# Patient Record
Sex: Female | Born: 1935 | Race: Black or African American | Hispanic: No | Smoking: Never smoker
Health system: Southern US, Community
[De-identification: ages and names within clinical notes are randomized; demographics above are authoritative.]

## PROBLEM LIST (undated history)

## (undated) DIAGNOSIS — Q2730 Arteriovenous malformation, site unspecified: Secondary | ICD-10-CM

## (undated) DIAGNOSIS — D509 Iron deficiency anemia, unspecified: Secondary | ICD-10-CM

## (undated) DIAGNOSIS — A048 Other specified bacterial intestinal infections: Secondary | ICD-10-CM

## (undated) DIAGNOSIS — E669 Obesity, unspecified: Secondary | ICD-10-CM

## (undated) HISTORY — DX: Arteriovenous malformation, site unspecified: Q27.30

## (undated) HISTORY — DX: Iron deficiency anemia, unspecified: D50.9

## (undated) HISTORY — DX: Other specified bacterial intestinal infections: A04.8

## (undated) HISTORY — DX: Obesity, unspecified: E66.9

## (undated) HISTORY — PX: TUBAL LIGATION: SHX77

---

## 2012-08-22 ENCOUNTER — Encounter (HOSPITAL_COMMUNITY): Payer: Self-pay | Admitting: Emergency Medicine

## 2012-08-22 ENCOUNTER — Emergency Department (HOSPITAL_COMMUNITY): Payer: Medicare Other

## 2012-08-22 ENCOUNTER — Emergency Department (HOSPITAL_COMMUNITY)
Admission: EM | Admit: 2012-08-22 | Discharge: 2012-08-22 | Disposition: A | Discharge status: Home / Self Care | Payer: Medicare Other | Attending: Emergency Medicine | Admitting: Emergency Medicine

## 2012-08-22 ENCOUNTER — Encounter (HOSPITAL_COMMUNITY): Payer: Self-pay | Admitting: *Deleted

## 2012-08-22 ENCOUNTER — Emergency Department (INDEPENDENT_AMBULATORY_CARE_PROVIDER_SITE_OTHER)
Admission: EM | Admit: 2012-08-22 | Discharge: 2012-08-22 | Disposition: A | Discharge status: Nursing Home (Includes ICF) | Payer: Medicare Other | Source: Home / Self Care | Attending: Emergency Medicine | Admitting: Emergency Medicine

## 2012-08-22 DIAGNOSIS — R110 Nausea: Secondary | ICD-10-CM | POA: Insufficient documentation

## 2012-08-22 DIAGNOSIS — I209 Angina pectoris, unspecified: Secondary | ICD-10-CM

## 2012-08-22 DIAGNOSIS — N201 Calculus of ureter: Secondary | ICD-10-CM | POA: Insufficient documentation

## 2012-08-22 DIAGNOSIS — R319 Hematuria, unspecified: Secondary | ICD-10-CM | POA: Insufficient documentation

## 2012-08-22 LAB — COMPREHENSIVE METABOLIC PANEL
AST: 19 U/L (ref 0–37)
Albumin: 3.7 g/dL (ref 3.5–5.2)
BUN: 16 mg/dL (ref 6–23)
Calcium: 9.9 mg/dL (ref 8.4–10.5)
Chloride: 100 mEq/L (ref 96–112)
Creatinine, Ser: 0.76 mg/dL (ref 0.50–1.10)
Total Protein: 8.7 g/dL — ABNORMAL HIGH (ref 6.0–8.3)

## 2012-08-22 LAB — URINALYSIS, ROUTINE W REFLEX MICROSCOPIC
Nitrite: NEGATIVE
Specific Gravity, Urine: 1.014 (ref 1.005–1.030)
Urobilinogen, UA: 1 mg/dL (ref 0.0–1.0)
pH: 6.5 (ref 5.0–8.0)

## 2012-08-22 LAB — TROPONIN I: Troponin I: <0.3 ng/mL (ref <0.30)

## 2012-08-22 LAB — CBC WITH DIFFERENTIAL/PLATELET
Basophils Absolute: 0 10*3/uL (ref 0.0–0.1)
Basophils Relative: 0 % (ref 0–1)
Eosinophils Absolute: 0 10*3/uL (ref 0.0–0.7)
Eosinophils Relative: 0 % (ref 0–5)
HCT: 38.3 % (ref 36.0–46.0)
MCH: 28.2 pg (ref 26.0–34.0)
MCHC: 32.1 g/dL (ref 30.0–36.0)
Monocytes Absolute: 0.3 10*3/uL (ref 0.1–1.0)
Neutro Abs: 11.2 10*3/uL — ABNORMAL HIGH (ref 1.7–7.7)
RDW: 12.8 % (ref 11.5–15.5)

## 2012-08-22 LAB — URINE MICROSCOPIC-ADD ON

## 2012-08-22 LAB — LIPASE, BLOOD: Lipase: 37 U/L (ref 11–59)

## 2012-08-22 MED ORDER — NITROGLYCERIN 0.4 MG SL SUBL: 0.4000 mg | SUBLINGUAL_TABLET | SUBLINGUAL | Status: DC | PRN

## 2012-08-22 MED ORDER — ASPIRIN 81 MG PO CHEW
CHEWABLE_TABLET | ORAL | Status: AC
  Filled 2012-08-22: qty 4

## 2012-08-22 MED ORDER — SODIUM CHLORIDE 0.9 % IV SOLN: INTRAVENOUS | Status: DC

## 2012-08-22 MED ORDER — SODIUM CHLORIDE 0.9 % IJ SOLN
INTRAMUSCULAR | Status: AC
  Filled 2012-08-22: qty 3

## 2012-08-22 MED ORDER — ASPIRIN 81 MG PO CHEW
324.0000 mg | CHEWABLE_TABLET | Freq: Once | ORAL | Status: AC
  Administered 2012-08-22: 324 mg via ORAL

## 2012-08-22 MED ORDER — NITROGLYCERIN 0.4 MG SL SUBL
SUBLINGUAL_TABLET | SUBLINGUAL | Status: AC
  Filled 2012-08-22: qty 25

## 2012-08-22 MED ORDER — OXYCODONE-ACETAMINOPHEN 5-325 MG PO TABS: 1.0000 | ORAL_TABLET | Freq: Four times a day (QID) | ORAL | Status: DC | PRN

## 2012-08-22 NOTE — ED Provider Notes (Signed)
History     CSN: [id number]  Arrival date & time 08/22/12  2006   First MD Initiated Contact with Patient 08/22/12 2009      Chief Complaint  Patient presents with  . Abdominal Pain  . Nausea    (Consider location/radiation/quality/duration/timing/severity/associated sxs/prior treatment) Patient is a 79 y.o. female presenting with abdominal pain.  Abdominal Pain Associated symptoms include abdominal pain.   Pt reports she has had intermittent episodes of sharp, moderate epigastric pain off and on all day, radiating into her back. No SOB, CP, or diaphoresis. She has had some nausea. No particular provoking or relieving factors but has not had any appetite for solids today. She drank some gingerale earlier without difficulty. Went to [facility] and sent to the ED for further eval. She is pain free now. Denies any vomiting, melena, diarrhea, constipation, dysuria or flank pain.    History reviewed. No pertinent past medical history.  History reviewed. No pertinent past surgical history.  No family history on file.  History  Substance Use Topics  . Smoking status: Never Smoker   . Smokeless tobacco: Not on file  . Alcohol Use: No    OB History   Grav Para Term Preterm Abortions TAB SAB Ect Mult Living                  Review of Systems  Gastrointestinal: Positive for abdominal pain.   All other systems reviewed and are negative except as noted in HPI.   Allergies  Review of patient's allergies indicates no known allergies.  Home Medications  No current outpatient prescriptions on file.  BP 155/60  Pulse 97  Temp(Src) 98 F (36.7 C) (Oral)  Resp 17  SpO2 98%  Physical Exam  Nursing note and vitals reviewed. Constitutional: She is oriented to person, place, and time. She appears well-developed and well-nourished.  HENT:  Head: Normocephalic and atraumatic.  Eyes: EOM are normal. Pupils are equal, round, and reactive to light.  Neck: Normal range of motion. Neck  supple.  Cardiovascular: Normal rate, normal heart sounds and intact distal pulses.   Pulmonary/Chest: Effort normal and breath sounds normal.  Abdominal: Bowel sounds are normal. She exhibits no distension. There is no tenderness.  Musculoskeletal: Normal range of motion. She exhibits no edema and no tenderness.  Neurological: She is alert and oriented to person, place, and time. She has normal strength. No cranial nerve deficit or sensory deficit.  Skin: Skin is warm and dry. No rash noted.  Psychiatric: She has a normal mood and affect.    ED Course  Procedures (including critical care time)  Labs Reviewed  CBC WITH DIFFERENTIAL - Abnormal; Notable for the following:    WBC 12.5 (*)    Neutrophils Relative % 90 (*)    Neutro Abs 11.2 (*)    Lymphocytes Relative 7 (*)    All other components within normal limits  COMPREHENSIVE METABOLIC PANEL - Abnormal; Notable for the following:    Glucose, Bld 259 (*)    Total Protein 8.7 (*)    GFR calc non Af Amer 75 (*)    GFR calc Af Amer 87 (*)    All other components within normal limits  URINALYSIS, ROUTINE W REFLEX MICROSCOPIC - Abnormal; Notable for the following:    APPearance CLOUDY (*)    Glucose, UA 500 (*)    Hgb urine dipstick LARGE (*)    Protein, ur 30 (*)    Leukocytes, UA TRACE (*)  All other components within normal limits  URINE MICROSCOPIC-ADD ON - Abnormal; Notable for the following:    Squamous Epithelial / LPF FEW (*)    All other components within normal limits  LIPASE, BLOOD  TROPONIN I   Ct Abdomen Pelvis Wo Contrast  08/22/2012   *RADIOLOGY REPORT*  Clinical Data: Some xyphoid and back pain  CT ABDOMEN AND PELVIS WITHOUT CONTRAST  Technique:  Multidetector CT imaging of the abdomen and pelvis was performed following the standard protocol without intravenous contrast.  Comparison: Ultrasound from the same day  Findings: Coarse linear scarring or subsegmental atelectasis in the visualized lung bases.  Coronary  calcifications.  Unremarkable uninfused evaluation of the liver, nondilated gallbladder, spleen, pancreas.  There is a 2.7 cm left adrenal mass of low attenuation suggesting adenoma, containing some coarse calcifications at its margins.  Right nephrolithiasis, largest stone or cluster in the upper pole measuring 12 mm.  There is a right ureterectasis down to the level of a 8 mm calculus at the ureteral orifice.  Urinary bladder is incompletely distended.  Left kidney unremarkable.  Patchy aortoiliac arterial calcifications.  Stomach, small bowel, and colon are nondilated.  Normal appendix.  Innumerable descending and sigmoid diverticula without adjacent inflammatory/edematous change.  Uterus and adnexal regions unremarkable.  Bilateral pelvic phleboliths.  No ascites.  No free air.  No adenopathy localized. Spondylitic changes throughout the lumbar spine.  IMPRESSION:  1.  Right nephrolithiasis and hydronephrosis with obstructing 8 mm calculus near the right ureteral orifice. 2.  2.7 cm left adrenal lesion probably benign adenoma in the absence of a history of primary carcinoma. 3.  Diverticulosis. 4. Atherosclerosis, including aortoiliac and coronary artery disease. Please note that although the presence of coronary artery calcium documents the presence of coronary artery disease, the severity of this disease and any potential stenosis cannot be assessed on this non-gated CT examination.  Assessment for potential risk factor modification, dietary therapy or pharmacologic therapy may be warranted, if clinically indicated.   Original Report Authenticated By: D. Andria Rhein, MD   US Abdomen Limited  08/22/2012   *RADIOLOGY REPORT*  Clinical Data:  Epigastric pain, nausea.  LIMITED ABDOMINAL ULTRASOUND - RIGHT UPPER QUADRANT  Comparison:  None.  Findings:  Gallbladder:  No shadowing gallstones or echogenic sludge.  No gallbladder wall thickening or pericholecystic fluid.  Negative sonographic Murphy's sign according  to the ultrasound technologist.  Common bile duct:  4.1 mm diameter, unremarkable.  Liver:  No focal mass lesion seen.  Within normal limits in parenchymal echogenicity.  Incidental note made of mild right hydronephrosis.  There is a 2.5 cm shadowing calculus in the lower pole right renal collecting system.  IMPRESSION:  1.  Normal gallbladder. 2.  Right nephrolithiasis with hydronephrosis.                    Original Report Authenticated By: D. Andria Rhein, MD     1. Right ureteral stone       MDM   Date: 08/22/2012  Rate: 103  Rhythm: sinus tachycardia and premature ventricular contractions (PVC)  QRS Axis: left  Intervals: normal  ST/T Wave abnormalities: nonspecific T wave changes  Conduction Disutrbances:right bundle branch block  Narrative Interpretation:   Old EKG Reviewed: none available  Symptoms are resolved at present. Will check abd labs, [place]. Troponin added as well for occult cardiac etiology. No chest pain or SOB suggestive of ACS.   11:04 PM Labs and imaging reviewed. Hematuria without signs of  infections. She has no gall stones, but kidney stones seen on [place] and confirmed in distal ureter on CT. Pain is well controlled now. Advised pain medications as needed and return for intractable pain, vomiting, or fever.          Charles B. Bernette Mayers, MD 08/22/12 [phone]

## 2012-08-22 NOTE — ED Notes (Signed)
Report given to [facility]. 

## 2012-08-22 NOTE — ED Notes (Signed)
Nitrostat not given... Unable to start IV.. Dr. Lorenz Coaster has been notified.

## 2012-08-22 NOTE — ED Notes (Signed)
Pt c/o epigastric pain and nauseas onset 1200/1300... Also reports that she has not had anything to eat today BP is 189/101... Denies: CP, SOB, HA, f/v/d, constipation She is alert and oriented w/no signs of acute distress.

## 2012-08-22 NOTE — ED Notes (Signed)
Pt comes from [facility]gent Care via [facility].  Pt report left lower chest/upper abd pain pain with some nausea since this morning.  325 ASA given by uregent care and IV attempted by UC staff and [facility] without success.

## 2012-08-22 NOTE — ED Provider Notes (Signed)
Chief Complaint:   Chief Complaint  Patient presents with  . Abdominal Pain    History of Present Illness:   Allison Choi is an 79 year old previously healthy female who has had recurring subxiphoid pain which radiates through to the back rated 8/10 in intensity since 12:30 today. The pain comes and goes and lasts 4-5 minutes at a time. There is nothing that makes it better or worse. It's been associated with nausea but no shortness of breath or diaphoresis. She denies any fever, chills, coughing, wheezing, or shortness of breath. She has not had palpitations, dizziness, or syncope. She denies any vomiting, constipation, diarrhea, blood in the stool, or melena. She's had no urinary symptoms or GYN complaints. She has had no prior history of heart problems, diabetes, high blood pressure, elevated cholesterol, or cigarette smoking.  Review of Systems:  Other than noted above, the patient denies any of the following symptoms. Systemic:  No fever, chills, sweats, or fatigue. ENT:  No nasal congestion, rhinorrhea, or sore throat. Pulmonary:  No cough, wheezing, shortness of breath, sputum production, hemoptysis. Cardiac:  No palpitations, rapid heartbeat, dizziness, presyncope or syncope. GI:  No abdominal pain, heartburn, nausea, or vomiting. Ext:  No leg pain or swelling.  PMFSH:  Past medical history, family history, social history, meds, and allergies were reviewed and updated as needed.   Physical Exam:   Vital signs:  BP 189/101  Pulse 109  Temp(Src) 98.1 F (36.7 C) (Oral)  Resp 16  SpO2 95% Gen:  Alert, oriented, in no distress, skin warm and dry. Eye:  PERRL, lids and conjunctivas normal.  Sclera non-icteric. ENT:  Mucous membranes moist, pharynx clear. Neck:  Supple, no adenopathy or tenderness.  No JVD. Lungs:  Clear to auscultation, no wheezes, rales or rhonchi.  No respiratory distress. Heart:  Regular rhythm.  No gallops, clicks or rubs. She has a grade 3/6 systolic ejection  murmur heard over the entire precordium. Chest:  No chest wall tenderness. Abdomen:  Soft, with mild tenderness to palpation over umbilicus without guarding or rebound, no organomegaly or mass.  Bowel sounds normal.  No pulsatile abdominal mass or bruit. Ext:  No edema.  No calf tenderness and Homann's sign negative.  Pulses full and equal. Skin:  Warm and dry.  No rash.  EKG:   Date: 08/22/2012  Rate: 103  Rhythm: sinus tachycardia and premature ventricular contractions (PVC)  QRS Axis: left  Intervals: normal  ST/T Wave abnormalities: normal  Conduction Disutrbances:right bundle branch block  Narrative Interpretation: Sinus tachycardia with occasional premature ventricular complexes and fusion complexes, left axis deviation, right bundle branch block.  Old EKG Reviewed: none available  Course in Urgent Care Center:   She was begun on IV normal saline at 50 mL per hour, monitored, given an option at 2 L per minute by nasal cannula, given nitroglycerin 0.4 mg sublingually, aspirin 325 mg by mouth.  Assessment:  The encounter diagnosis was Angina pectoris.  Differential diagnosis includes angina pectoris, esophagitis, gastritis, ulcer disease, or gallbladder disease.   Plan:   1.  The following meds were prescribed:   New Prescriptions   No medications on file   2.  The patient was transported to the emergency department via [facility] in stable condition.  Medical Decision Making:  79 year old female has a history of recurrent subxiphoid pain with radiation to back since noon today.  Associated with nausea, but no diaphoresis or shortness of breath.  No cardiac history.  EKG shows RBBB,  LAD, freq PVCs and otherwise normal.  No acute ischemic changes.  I am still concerned about angina and will transfer by [facility].  Will give TNG and ASA.     Reuben Likes, MD 08/22/12 [phone]

## 2015-05-03 ENCOUNTER — Inpatient Hospital Stay (HOSPITAL_COMMUNITY): Payer: Medicare Other

## 2015-05-03 ENCOUNTER — Encounter (HOSPITAL_COMMUNITY): Payer: Self-pay | Admitting: Emergency Medicine

## 2015-05-03 ENCOUNTER — Inpatient Hospital Stay (HOSPITAL_COMMUNITY)
Admission: EM | Admit: 2015-05-03 | Discharge: 2015-05-06 | DRG: 812 | Disposition: A | Discharge status: Home / Self Care | Payer: Medicare Other | Attending: Internal Medicine | Admitting: Internal Medicine

## 2015-05-03 DIAGNOSIS — D649 Anemia, unspecified: Secondary | ICD-10-CM | POA: Diagnosis not present

## 2015-05-03 DIAGNOSIS — R0602 Shortness of breath: Secondary | ICD-10-CM | POA: Diagnosis present

## 2015-05-03 DIAGNOSIS — Z6839 Body mass index (BMI) 39.0-39.9, adult: Secondary | ICD-10-CM | POA: Diagnosis not present

## 2015-05-03 DIAGNOSIS — E669 Obesity, unspecified: Secondary | ICD-10-CM | POA: Diagnosis present

## 2015-05-03 DIAGNOSIS — K922 Gastrointestinal hemorrhage, unspecified: Secondary | ICD-10-CM | POA: Diagnosis present

## 2015-05-03 DIAGNOSIS — K648 Other hemorrhoids: Secondary | ICD-10-CM | POA: Diagnosis present

## 2015-05-03 DIAGNOSIS — D509 Iron deficiency anemia, unspecified: Secondary | ICD-10-CM | POA: Diagnosis present

## 2015-05-03 DIAGNOSIS — I517 Cardiomegaly: Secondary | ICD-10-CM | POA: Diagnosis present

## 2015-05-03 DIAGNOSIS — K635 Polyp of colon: Secondary | ICD-10-CM | POA: Diagnosis present

## 2015-05-03 DIAGNOSIS — Q2733 Arteriovenous malformation of digestive system vessel: Secondary | ICD-10-CM

## 2015-05-03 DIAGNOSIS — D120 Benign neoplasm of cecum: Secondary | ICD-10-CM | POA: Diagnosis not present

## 2015-05-03 DIAGNOSIS — D125 Benign neoplasm of sigmoid colon: Secondary | ICD-10-CM | POA: Diagnosis not present

## 2015-05-03 DIAGNOSIS — E1165 Type 2 diabetes mellitus with hyperglycemia: Secondary | ICD-10-CM | POA: Diagnosis present

## 2015-05-03 DIAGNOSIS — R739 Hyperglycemia, unspecified: Secondary | ICD-10-CM | POA: Diagnosis present

## 2015-05-03 LAB — COMPREHENSIVE METABOLIC PANEL WITH GFR
ALT: 9 U/L — ABNORMAL LOW (ref 14–54)
AST: 15 U/L (ref 15–41)
Albumin: 2.9 g/dL — ABNORMAL LOW (ref 3.5–5.0)
Alkaline Phosphatase: 78 U/L (ref 38–126)
Anion gap: 12 (ref 5–15)
BUN: 10 mg/dL (ref 6–20)
CO2: 24 mmol/L (ref 22–32)
Calcium: 9.3 mg/dL (ref 8.9–10.3)
Chloride: 102 mmol/L (ref 101–111)
Creatinine, Ser: 0.8 mg/dL (ref 0.44–1.00)
GFR calc Af Amer: >60 mL/min (ref >60)
GFR calc non Af Amer: >60 mL/min (ref >60)
Glucose, Bld: 200 mg/dL — ABNORMAL HIGH (ref 65–99)
Potassium: 4.9 mmol/L (ref 3.5–5.1)
Sodium: 138 mmol/L (ref 135–145)
Total Bilirubin: <0.1 mg/dL — ABNORMAL LOW (ref 0.3–1.2)
Total Protein: 7.9 g/dL (ref 6.5–8.1)

## 2015-05-03 LAB — CBC
HCT: 21 % — ABNORMAL LOW (ref 36.0–46.0)
Hemoglobin: 5.2 g/dL — CL (ref 12.0–15.0)
MCH: 15.9 pg — ABNORMAL LOW (ref 26.0–34.0)
MCHC: 24.8 g/dL — ABNORMAL LOW (ref 30.0–36.0)
MCV: 64 fL — ABNORMAL LOW (ref 78.0–100.0)
Platelets: 412 K/uL — ABNORMAL HIGH (ref 150–400)
RBC: 3.28 MIL/uL — ABNORMAL LOW (ref 3.87–5.11)
RDW: 21.4 % — ABNORMAL HIGH (ref 11.5–15.5)
WBC: 9.5 K/uL (ref 4.0–10.5)

## 2015-05-03 LAB — CBC WITH DIFFERENTIAL/PLATELET
BASOS ABS: 0 10*3/uL (ref 0.0–0.1)
BASOS PCT: 0 %
Eosinophils Absolute: 0.1 10*3/uL (ref 0.0–0.7)
Eosinophils Relative: 1 %
HEMATOCRIT: 21 % — AB (ref 36.0–46.0)
Hemoglobin: 5.3 g/dL — CL (ref 12.0–15.0)
LYMPHS ABS: 1.6 10*3/uL (ref 0.7–4.0)
Lymphocytes Relative: 17 %
MCH: 16.1 pg — AB (ref 26.0–34.0)
MCHC: 25.2 g/dL — AB (ref 30.0–36.0)
MCV: 63.8 fL — ABNORMAL LOW (ref 78.0–100.0)
MONOS PCT: 7 %
Monocytes Absolute: 0.7 10*3/uL (ref 0.1–1.0)
NEUTROS ABS: 7.1 10*3/uL (ref 1.7–7.7)
Neutrophils Relative %: 75 %
Platelets: 422 10*3/uL — ABNORMAL HIGH (ref 150–400)
RBC: 3.29 MIL/uL — ABNORMAL LOW (ref 3.87–5.11)
RDW: 21.4 % — AB (ref 11.5–15.5)
WBC: 9.5 10*3/uL (ref 4.0–10.5)

## 2015-05-03 LAB — LACTATE DEHYDROGENASE: LDH: 167 U/L (ref 98–192)

## 2015-05-03 LAB — DIFFERENTIAL
Basophils Absolute: 0.1 10*3/uL (ref 0.0–0.1)
Basophils Relative: 1 %
Eosinophils Absolute: 0.1 10*3/uL (ref 0.0–0.7)
Eosinophils Relative: 1 %
LYMPHS ABS: 1.7 10*3/uL (ref 0.7–4.0)
LYMPHS PCT: 16 %
MONOS PCT: 7 %
Monocytes Absolute: 0.8 10*3/uL (ref 0.1–1.0)
NEUTROS ABS: 7.8 10*3/uL — AB (ref 1.7–7.7)
Neutrophils Relative %: 75 %
SMEAR REVIEW: INCREASED

## 2015-05-03 LAB — RETICULOCYTES
RBC.: 3.26 MIL/uL — AB (ref 3.87–5.11)
RETIC COUNT ABSOLUTE: 68.5 10*3/uL (ref 19.0–186.0)
RETIC CT PCT: 2.1 % (ref 0.4–3.1)

## 2015-05-03 LAB — PREPARE RBC (CROSSMATCH)

## 2015-05-03 LAB — POC OCCULT BLOOD, ED: Fecal Occult Bld: POSITIVE — AB

## 2015-05-03 LAB — ABO/RH: ABO/RH(D): B POS

## 2015-05-03 LAB — SAVE SMEAR

## 2015-05-03 MED ORDER — PANTOPRAZOLE SODIUM 40 MG IV SOLR
40.0000 mg | Freq: Two times a day (BID) | INTRAVENOUS | Status: DC
  Administered 2015-05-04: 40 mg via INTRAVENOUS
  Filled 2015-05-03: qty 40

## 2015-05-03 MED ORDER — ONDANSETRON HCL 4 MG/2ML IJ SOLN: 4.0000 mg | Freq: Four times a day (QID) | INTRAMUSCULAR | Status: DC | PRN

## 2015-05-03 MED ORDER — ONDANSETRON HCL 4 MG PO TABS: 4.0000 mg | ORAL_TABLET | Freq: Four times a day (QID) | ORAL | Status: DC | PRN

## 2015-05-03 MED ORDER — ACETAMINOPHEN 650 MG RE SUPP: 650.0000 mg | Freq: Four times a day (QID) | RECTAL | Status: DC | PRN

## 2015-05-03 MED ORDER — ACETAMINOPHEN 325 MG PO TABS: 650.0000 mg | ORAL_TABLET | Freq: Four times a day (QID) | ORAL | Status: DC | PRN

## 2015-05-03 MED ORDER — SODIUM CHLORIDE 0.9 % IV SOLN
INTRAVENOUS | Status: DC
  Administered 2015-05-04: 01:00:00 via INTRAVENOUS

## 2015-05-03 MED ORDER — SODIUM CHLORIDE 0.9 % IV SOLN: Freq: Once | INTRAVENOUS | Status: DC

## 2015-05-03 NOTE — ED Provider Notes (Signed)
CSN: [id number]     Arrival date & time 05/03/15  1835 History   First MD Initiated Contact with Patient 05/03/15 2040     Chief Complaint  Patient presents with  . Anemia     (Consider location/radiation/quality/duration/timing/severity/associated sxs/prior Treatment) Patient is a 82 y.o. female presenting with weakness.  Weakness This is a new problem. The current episode started more than 1 week ago. The problem occurs constantly. The problem has been gradually worsening. Pertinent negatives include no chest pain, no abdominal pain and no shortness of breath. The symptoms are aggravated by walking. The symptoms are relieved by rest. She has tried nothing for the symptoms. The treatment provided no relief.    History reviewed. No pertinent past medical history. Past Surgical History  Procedure Laterality Date  . Tubal ligation     Family History  Problem Relation Age of Onset  . Diabetes Mellitus II Son    Social History  Substance Use Topics  . Smoking status: Never Smoker   . Smokeless tobacco: None  . Alcohol Use: No   OB History    No data available     Review of Systems  Constitutional: Positive for fatigue. Negative for fever and chills.  Respiratory: Negative for cough and shortness of breath.   Cardiovascular: Negative for chest pain.  Gastrointestinal: Negative for nausea, vomiting, abdominal pain, blood in stool and abdominal distention.  Musculoskeletal: Negative for back pain and gait problem.  Skin: Positive for pallor. Negative for rash.  Neurological: Positive for weakness.  All other systems reviewed and are negative.     Allergies  Review of patient's allergies indicates no known allergies.  Home Medications   Prior to Admission medications   Medication Sig Start Date End Date Taking? Authorizing Provider  aspirin EC 81 MG tablet Take 162 mg by mouth daily.   Yes Historical Provider, MD  oxyCODONE-acetaminophen (PERCOCET/ROXICET) 5-325 MG per  tablet Take 1-2 tablets by mouth every 6 (six) hours as needed for pain. Patient not taking: Reported on 05/03/2015 08/22/12   Calvert Cantor, MD   BP 136/68 mmHg  Pulse 73  Temp(Src) 97.4 F (36.3 C) (Oral)  Resp 18  Ht 5\' 3"  (1.6 m)  Wt 223 lb 1.7 oz (101.2 kg)  BMI 39.53 kg/m2  SpO2 98% Physical Exam  Constitutional: She is oriented to person, place, and time. She appears well-developed and well-nourished.  HENT:  Head: Normocephalic and atraumatic.  Eyes:  Conjunctival pallor  Neck: Normal range of motion.  Cardiovascular: Normal rate and regular rhythm.   Pulmonary/Chest: No stridor. No respiratory distress.  Abdominal: Soft. She exhibits no distension.  Musculoskeletal: She exhibits no edema or tenderness.  Neurological: She is alert and oriented to person, place, and time.  Skin: Skin is warm and dry.  Nursing note and vitals reviewed.   ED Course  Procedures (including critical care time) Labs Review Labs Reviewed  COMPREHENSIVE METABOLIC PANEL - Abnormal; Notable for the following:    Glucose, Bld 200 (*)    Albumin 2.9 (*)    ALT 9 (*)    Total Bilirubin <0.1 (*)    All other components within normal limits  CBC - Abnormal; Notable for the following:    RBC 3.28 (*)    Hemoglobin 5.2 (*)    HCT 21.0 (*)    MCV 64.0 (*)    MCH 15.9 (*)    MCHC 24.8 (*)    RDW 21.4 (*)    Platelets 412 (*)  All other components within normal limits  CBC WITH DIFFERENTIAL/PLATELET - Abnormal; Notable for the following:    RBC 3.29 (*)    Hemoglobin 5.3 (*)    HCT 21.0 (*)    MCV 63.8 (*)    MCH 16.1 (*)    MCHC 25.2 (*)    RDW 21.4 (*)    Platelets 422 (*)    All other components within normal limits  DIFFERENTIAL - Abnormal; Notable for the following:    Neutro Abs 7.8 (*)    All other components within normal limits  IRON AND TIBC - Abnormal; Notable for the following:    Iron 9 (*)    Saturation Ratios 2 (*)    All other components within normal limits   FERRITIN - Abnormal; Notable for the following:    Ferritin 4 (*)    All other components within normal limits  RETICULOCYTES - Abnormal; Notable for the following:    RBC. 3.26 (*)    All other components within normal limits  BASIC METABOLIC PANEL - Abnormal; Notable for the following:    Glucose, Bld 130 (*)    Calcium 8.8 (*)    All other components within normal limits  CBC - Abnormal; Notable for the following:    Hemoglobin 7.5 (*)    HCT 26.4 (*)    MCV 67.5 (*)    MCH 19.2 (*)    MCHC 28.4 (*)    RDW 22.1 (*)    All other components within normal limits  POC OCCULT BLOOD, ED - Abnormal; Notable for the following:    Fecal Occult Bld POSITIVE (*)    All other components within normal limits  SAVE SMEAR  LACTATE DEHYDROGENASE  VITAMIN B12  FOLATE  HEMOGLOBIN A1C  TYPE AND SCREEN  ABO/RH  PREPARE RBC (CROSSMATCH)  PREPARE RBC (CROSSMATCH)    Imaging Review Dg Chest Port 1 View  05/04/2015  CLINICAL DATA:  Shortness of breath tonight. EXAM: PORTABLE CHEST 1 VIEW COMPARISON:  None. FINDINGS: Mild cardiomegaly. Aortic atherosclerosis. No pulmonary edema. Minimal subsegmental retrocardiac atelectasis. No confluent airspace disease. No pleural effusion or pneumothorax. IMPRESSION: Mild cardiomegaly.  No localizing process. Electronically Signed   By: Jeb Levering M.D.   On: 05/04/2015 02:03   I have personally reviewed and evaluated these images and lab results as part of my medical decision-making.   EKG Interpretation None      MDM   Final diagnoses:  SOB (shortness of breath)    82 yo F w/ symptomatic anemia. Hemoccult positive, however with Hb of 5.2 i would suspect another cause as well. Transfusion initiated in ED, will continue as inpatient.     Merrily Pew, MD 05/04/15 [phone]

## 2015-05-03 NOTE — H&P (Signed)
[facility] History and Physical  Allison Choi [id number] DOB: [birth year] DOA: 05/03/2015  Referring physician: Dr. Dolly Rias. PCP: Elyn Peers, MD  Specialists: None.  Chief Complaint: Weakness and shortness of breath.  HPI: Allison Choi is a 82 y.o. female with no significant past medical history has been experiencing fatigue weakness and shortness of breath on exertion over the last few weeks. Patient had followed up with urgent care and was found to have hemoglobin around 5 which was confirmed in the ER. Patient denies having any weight loss blood in the stools chest pain. Stool for occult blood was positive but not grossly melanotic. Patient has been admitted for symptomatic anemia with possible chronic GI bleed. Patient has not had any colonoscopy previously. Patient has not been to a physician for long time.   Review of Systems: As presented in the history of presenting illness, rest negative.  History reviewed. No pertinent past medical history. Past Surgical History  Procedure Laterality Date  . Tubal ligation     Social History:  reports that she has never smoked. She does not have any smokeless tobacco history on file. She reports that she does not drink alcohol or use illicit drugs. Where does patient live home. Can patient participate in ADLs? Yes.  No Known Allergies  Family History:  Family History  Problem Relation Age of Onset  . Diabetes Mellitus II Son       Prior to Admission medications   Medication Sig Start Date End Date Taking? Authorizing Provider  oxyCODONE-acetaminophen (PERCOCET/ROXICET) 5-325 MG per tablet Take 1-2 tablets by mouth every 6 (six) hours as needed for pain. 08/22/12   Calvert Cantor, MD    Physical Exam: Filed Vitals:   05/03/15 2145 05/03/15 2200 05/03/15 2215 05/03/15 2236  BP: 125/46 116/48 122/61 150/58  Pulse: 104 102 98 103  Temp:      TempSrc:      Resp: 29 26 22 17   SpO2: 100% 100% 100% 100%     General:   Moderately built and nourished.  Eyes: Anicteric mild pallor.  ENT: No discharge from the ears eyes nose and mouth.  Neck: No mass felt.  Cardiovascular: S1-S2 heard.  Respiratory: No rhonchi or crepitations.  Abdomen: Soft nontender bowel sounds present.  Skin: No rash. Appears pale.  Musculoskeletal: Left ankle looks mildly swollen.  Psychiatric: Appears normal.  Neurologic: Alert awake oriented to time place and person. Moves all extremities.  Labs on Admission:  Basic Metabolic Panel:  Recent Labs Lab 05/03/15 1915  NA 138  K 4.9  CL 102  CO2 24  GLUCOSE 200*  BUN 10  CREATININE 0.80  CALCIUM 9.3   Liver Function Tests:  Recent Labs Lab 05/03/15 1915  AST 15  ALT 9*  ALKPHOS 78  BILITOT <0.1*  PROT 7.9  ALBUMIN 2.9*   No results for input(s): LIPASE, AMYLASE in the last 168 hours. No results for input(s): AMMONIA in the last 168 hours. CBC:  Recent Labs Lab 05/03/15 1915  WBC 9.5  NEUTROABS 7.8*  HGB 5.2*  HCT 21.0*  MCV 64.0*  PLT 412*   Cardiac Enzymes: No results for input(s): CKTOTAL, CKMB, CKMBINDEX, TROPONINI in the last 168 hours.  BNP (last 3 results) No results for input(s): BNP in the last 8760 hours.  ProBNP (last 3 results) No results for input(s): PROBNP in the last 8760 hours.  CBG: No results for input(s): GLUCAP in the last 168 hours.  Radiological Exams on Admission: No results found.  Assessment/Plan Principal Problem:   Symptomatic anemia Active Problems:   GI bleed   Hyperglycemia   1. Symptomatic anemia probably from chronic GI bleed - check anemia panel. At this time 2 units of packed red blood cell transfusion has been ordered. Chest x-ray is pending. Recheck CBC in a.m. 2. GI bleed probably chronic given the microcytic hypochromic picture - patient denies having taken any NSAIDs or aspirin. For now I have placed patient on Protonix IV and full liquid diet. Patient is willing to have procedures done  and may consult GI in a.m. 3. Hyperglycemia - check hemoglobin A1c.   DVT Prophylaxis SCDs.  Code Status: DO NOT INTUBATE.  Family Communication: Discussed with patient's daughter.  Disposition Plan: Admit to inpatient.    KAKRAKANDY,ARSHAD N. [facility] Pager [phone].  If 7PM-7AM, please contact night-coverage [email or web address] Password [facility] 05/03/2015, 10:38 PM

## 2015-05-03 NOTE — ED Notes (Signed)
Attempted report 

## 2015-05-03 NOTE — ED Notes (Signed)
Pt sts sent here for blood transfusion due to low hgb; pt sts increased fatigue x 1 month; pt appears pale

## 2015-05-04 DIAGNOSIS — D509 Iron deficiency anemia, unspecified: Principal | ICD-10-CM

## 2015-05-04 DIAGNOSIS — D649 Anemia, unspecified: Secondary | ICD-10-CM

## 2015-05-04 DIAGNOSIS — R739 Hyperglycemia, unspecified: Secondary | ICD-10-CM

## 2015-05-04 LAB — BASIC METABOLIC PANEL
Anion gap: 10 (ref 5–15)
BUN: 7 mg/dL (ref 6–20)
CO2: 24 mmol/L (ref 22–32)
CREATININE: 0.67 mg/dL (ref 0.44–1.00)
Calcium: 8.8 mg/dL — ABNORMAL LOW (ref 8.9–10.3)
Chloride: 105 mmol/L (ref 101–111)
GFR calc Af Amer: >60 mL/min (ref >60)
GLUCOSE: 130 mg/dL — AB (ref 65–99)
Potassium: 3.9 mmol/L (ref 3.5–5.1)
SODIUM: 139 mmol/L (ref 135–145)

## 2015-05-04 LAB — CBC
HEMATOCRIT: 26.4 % — AB (ref 36.0–46.0)
Hemoglobin: 7.5 g/dL — ABNORMAL LOW (ref 12.0–15.0)
MCH: 19.2 pg — ABNORMAL LOW (ref 26.0–34.0)
MCHC: 28.4 g/dL — AB (ref 30.0–36.0)
MCV: 67.5 fL — AB (ref 78.0–100.0)
PLATELETS: 361 10*3/uL (ref 150–400)
RBC: 3.91 MIL/uL (ref 3.87–5.11)
RDW: 22.1 % — ABNORMAL HIGH (ref 11.5–15.5)
WBC: 8.4 10*3/uL (ref 4.0–10.5)

## 2015-05-04 LAB — FERRITIN: Ferritin: 4 ng/mL — ABNORMAL LOW (ref 11–307)

## 2015-05-04 LAB — IRON AND TIBC
IRON: 9 ug/dL — AB (ref 28–170)
SATURATION RATIOS: 2 % — AB (ref 10.4–31.8)
TIBC: 385 ug/dL (ref 250–450)
UIBC: 376 ug/dL

## 2015-05-04 LAB — FOLATE: Folate: 10.4 ng/mL (ref >5.9)

## 2015-05-04 LAB — VITAMIN B12: Vitamin B-12: 202 pg/mL (ref 180–914)

## 2015-05-04 LAB — PREPARE RBC (CROSSMATCH)

## 2015-05-04 MED ORDER — PEG-KCL-NACL-NASULF-NA ASC-C 100 G PO SOLR
0.5000 | Freq: Once | ORAL | Status: AC
  Administered 2015-05-05: 100 g via ORAL
  Filled 2015-05-04 (×2): qty 1

## 2015-05-04 MED ORDER — METOCLOPRAMIDE HCL 5 MG/ML IJ SOLN
10.0000 mg | Freq: Once | INTRAMUSCULAR | Status: AC
  Administered 2015-05-04: 10 mg via INTRAVENOUS
  Filled 2015-05-04: qty 2

## 2015-05-04 MED ORDER — PEG-KCL-NACL-NASULF-NA ASC-C 100 G PO SOLR
0.5000 | Freq: Once | ORAL | Status: AC
  Administered 2015-05-04: 100 g via ORAL
  Filled 2015-05-04: qty 1

## 2015-05-04 MED ORDER — SODIUM CHLORIDE 0.9 % IV SOLN: Freq: Once | INTRAVENOUS | Status: DC

## 2015-05-04 MED ORDER — PANTOPRAZOLE SODIUM 40 MG PO TBEC
40.0000 mg | DELAYED_RELEASE_TABLET | Freq: Every day | ORAL | Status: DC
  Administered 2015-05-04 – 2015-05-06 (×3): 40 mg via ORAL
  Filled 2015-05-04 (×3): qty 1

## 2015-05-04 MED ORDER — METOCLOPRAMIDE HCL 5 MG/ML IJ SOLN
10.0000 mg | Freq: Once | INTRAMUSCULAR | Status: AC
  Administered 2015-05-05: 10 mg via INTRAVENOUS
  Filled 2015-05-04: qty 2

## 2015-05-04 MED ORDER — BISACODYL 5 MG PO TBEC
10.0000 mg | DELAYED_RELEASE_TABLET | Freq: Four times a day (QID) | ORAL | Status: AC
  Administered 2015-05-04 (×2): 10 mg via ORAL
  Filled 2015-05-04 (×2): qty 2

## 2015-05-04 MED ORDER — PEG-KCL-NACL-NASULF-NA ASC-C 100 G PO SOLR: 1.0000 | Freq: Once | ORAL | Status: DC

## 2015-05-04 NOTE — Progress Notes (Addendum)
[facility] PROGRESS NOTE  Allison Choi [id number] DOB: October 03, 1925 DOA: 05/03/2015 PCP: Elyn Peers, MD  Assessment/Plan:  1. Severe Iron defi anemia/Symptomatic anemia/hemoccult positive -suspect slow GI bleed, s/p 2units PRBC -will give 1 more unit today -never had a colonoscopy -will give Iv Iron this admission -[facility] consulted  2. Hyperglycemia -no h/o DM, FU hemoglobin A1c.  3. Obese/deconditioned -Pt/Ot eval  DVT Prophylaxis SCDs.   Code Status: DO NOT INTUBATE.  Family Communication: daughter at bedside Disposition Plan: home pending workup  Consultants:  [facility]    HPI/Subjective: Feels ok   Objective: Filed Vitals:   05/04/15 0421 05/04/15 0536  BP: 141/61 109/69  Pulse: 101 100  Temp: 98.1 F (36.7 C) 98.1 F (36.7 C)  Resp: 20 18    Intake/Output Summary (Last 24 hours) at 05/04/15 0935 Last data filed at 05/04/15 0617  Gross per 24 hour  Intake    670 ml  Output    450 ml  Net    220 ml   Filed Weights   05/03/15 2331  Weight: 101.2 kg (223 lb 1.7 oz)    Exam:   General: AAOx3  Cardiovascular:S1S2/RRR  Respiratory: CTAB  Abdomen: soft, Nt, BS present  Musculoskeletal: no edema c/c   Data Reviewed: Basic Metabolic Panel:  Recent Labs Lab 05/03/15 1915 05/04/15 0702  NA 138 139  K 4.9 3.9  CL 102 105  CO2 24 24  GLUCOSE 200* 130*  BUN 10 7  CREATININE 0.80 0.67  CALCIUM 9.3 8.8*   Liver Function Tests:  Recent Labs Lab 05/03/15 1915  AST 15  ALT 9*  ALKPHOS 78  BILITOT <0.1*  PROT 7.9  ALBUMIN 2.9*   No results for input(s): LIPASE, AMYLASE in the last 168 hours. No results for input(s): AMMONIA in the last 168 hours. CBC:  Recent Labs Lab 05/03/15 1915 05/03/15 2226 05/04/15 0702  WBC 9.5 9.5 8.4  NEUTROABS 7.8* 7.1  --   HGB 5.2* 5.3* 7.5*  HCT 21.0* 21.0* 26.4*  MCV 64.0* 63.8* 67.5*  PLT 412* 422* 361   Cardiac Enzymes: No results for input(s): CKTOTAL, CKMB,  CKMBINDEX, TROPONINI in the last 168 hours. BNP (last 3 results) No results for input(s): BNP in the last 8760 hours.  ProBNP (last 3 results) No results for input(s): PROBNP in the last 8760 hours.  CBG: No results for input(s): GLUCAP in the last 168 hours.  No results found for this or any previous visit (from the past 240 hour(s)).   Studies: Dg Chest Port 1 View  05/04/2015  CLINICAL DATA:  Shortness of breath tonight. EXAM: PORTABLE CHEST 1 VIEW COMPARISON:  None. FINDINGS: Mild cardiomegaly. Aortic atherosclerosis. No pulmonary edema. Minimal subsegmental retrocardiac atelectasis. No confluent airspace disease. No pleural effusion or pneumothorax. IMPRESSION: Mild cardiomegaly.  No localizing process. Electronically Signed   By: Jeb Levering M.D.   On: 05/04/2015 02:03    Scheduled Meds: . pantoprazole (PROTONIX) IV  40 mg Intravenous Q12H   Continuous Infusions: . sodium chloride 50 mL (05/04/15 0420)   Antibiotics Given (last 72 hours)    None      Principal Problem:   Symptomatic anemia Active Problems:   GI bleed   Hyperglycemia    Time spent: 32min    [facility] Pager [phone]. If 7PM-7AM, please contact night-coverage at [email or web address], password [facility] 05/04/2015, 9:35 AM  LOS: 1 day

## 2015-05-04 NOTE — Progress Notes (Signed)
   05/04/15 1000  Clinical Encounter Type  Visited With Health care provider  Visit Type Initial (Initial ADV. Directrive)  Referral From Nurse  Spiritual Encounters  Spiritual Needs Literature  CH went to pt room; pt unavailable; literature for adv. Directive given; please contact [facility] [phone] once filled to review and complete.  [place] available until 14:30 to arrange witness/notary.

## 2015-05-04 NOTE — Progress Notes (Addendum)
Pt BP 172/98 with a HR of 106. Text paged K.Schorr. No new orders at this time    2250 pt Bp is now 136/55 with a HR of 92. Will continue to monitor

## 2015-05-04 NOTE — Care Management Note (Signed)
Case Management Note  Patient Details  Name: Allison Choi MRN: [id number] Date of Birth: 1926/02/12  Subjective/Objective:                  Date-05-04-15 Initial Assessment Spoke with patient at the bedside along with daughter.  Introduced self as Tourist information centre manager and explained role in discharge planning and how to be reached.  Verified patient lives in  [place] alone.  Verified patient anticipates to go home alone at time of discharge and will have  limited supervision by family and friends at this time to best of their knowledge.  Patient has DME walker (rolator that she paid out of pocket for). Expressed potential need for rolator, shower stool. Referral placed to [facility], and order placed.  Patient denied needing help with their medication.  Patient is driven by family and friends to MD appointments.  Verified patient has PCP Criss Rosales Patient states they currently receive [facility] services through no one.   Admission Comments:  Patient lives at home alone, has family and friends that check in on her everyday. She uses a rolator that she doesn't like and paid out of pocket for, order placed for new one, also asked for shower seat, order placed, and referral made to [facility] to be delivered to room prior to discharge. Patient's daughter asked for POA papers, Percell Locus CSW notified. Left list of [place] agencies, patient's daughter requested [facility] PT OT. Requested order for PT OT evals to Dr Fanny Bien. [facility] referral made due to age, and patient living alone with symptomatic (possible long term) anemia.   Carles Collet RN BSN CM [phone]   Action/Plan:  CM identified potential HH need and DME.  Expected Discharge Date:                  Expected Discharge Plan:  [place]  In-House Referral:     Discharge planning Services  CM Consult  Post Acute Care Choice:  Durable Medical Equipment, Home Health Choice offered to:  Patient, Adult Children  DME Arranged:  Walker rolling with seat,  Shower stool DME Agency:  [place]:    [place] Agency:     Status of Service:  In process, will continue to follow  Medicare Important Message Given:    Date Medicare IM Given:    Medicare IM give by:    Date Additional Medicare IM Given:    Additional Medicare Important Message give by:     If discussed at [place] of Stay Meetings, dates discussed:    Additional Comments:  Carles Collet, RN 05/04/2015, 11:35 AM

## 2015-05-04 NOTE — Consult Note (Signed)
[facility] Consult: 9:41 AM 05/04/2015  LOS: 1 day    Referring Provider: Dr Broadus John  Primary Care Physician:  Elyn Peers, MD (not seeing her regularly) Primary Gastroenterologist:  unassigned     Reason for Consultation:  FOBT +, microcytic anemia.    HPI: Allison Choi is a 82 y.o. female.  Patient lives independently at home with some paid help for cleaning and heavier work but she is able to cook her own meals.   Hx obstructing right ureteral and kidney stone 2014.  Diverticulosis on CT in 2014.  Left adrenal (adenoma?) on 2014 CT.   CAD on 2014 CT.  Obesity (BMI 39.6 currently).  Status post tubal ligation in the 1960s.    Came to urgent care 2/121 with at least a few weeks progressive fatigue, weakness, DOE.  Sent to ED with new onset anemia.   Hgb 5.2 (was 12.3 in 08/2012), MCV 64.  Ferritin 4. Glucose 200.  LFTs normal except low albumin at 2.9.  Cardiomegaly on xray.   No weight loss, melena, BPR, altered stool pattern/appearance.  For a few weeks the patient's appetite has been diminished but there is been no nausea or vomiting. She has no history of dysphagia. No significant history of abdominal pain or pyrosis.  No chest pain. No NSAIDs.  No ETOH.  She takes low-dose aspirin daily but no other NSAIDs.    History reviewed. No pertinent past medical history.  Past Surgical History  Procedure Laterality Date  . Tubal ligation      Prior to Admission medications   Medication Sig Start Date End Date Taking? Authorizing Provider  aspirin EC 81 MG tablet Take 162 mg by mouth daily.   Yes Historical Provider, MD           Scheduled Meds: . sodium chloride   Intravenous Once  . pantoprazole (PROTONIX) IV  40 mg Intravenous Q12H   Infusions:   PRN Meds: acetaminophen **OR** acetaminophen,  ondansetron **OR** ondansetron (ZOFRAN) IV   Allergies as of 05/03/2015  . (No Known Allergies)    Family History  Problem Relation Age of Onset  . Diabetes Mellitus II Son     Social History   Social History  . Marital Status: Divorced    Spouse Name: N/A  . Number of Children: N/A  . Years of Education: N/A   Occupational History  . Not on file.   Social History Main Topics  . Smoking status: Never Smoker   . Smokeless tobacco: Not on file  . Alcohol Use: No  . Drug Use: No  . Sexual Activity: Not Currently   Other Topics Concern  . Not on file   Social History Narrative    REVIEW OF SYSTEMS: Constitutional:  Patient is dependent on a walker for mobility. She had a mishap with some furniture at the house about a month ago and fell. Her mobility since then has been limited. ENT:  No nose bleeds Pulm:  DOE. No cough. No pleuritic pain. CV:  No palpitations, no LE edema. No chest pain  GU:  No hematuria, no frequency.  No hematuria. GI:  Per HPI Heme:  No issues with excessive bleeding or bruising. No previous issues with low blood counts.   Transfusions:  Was never transfused before last night. Neuro:  No headaches, no peripheral tingling or numbness Derm:  No itching, no rash or sores.  Endocrine:  No sweats or chills.  No polyuria or dysuria Immunization:  Not current on immunizations. Travel:  None beyond local counties in last few months.    PHYSICAL EXAM: Vital signs in last 24 hours: Filed Vitals:   05/04/15 0421 05/04/15 0536  BP: 141/61 109/69  Pulse: 101 100  Temp: 98.1 F (36.7 C) 98.1 F (36.7 C)  Resp: 20 18   Wt Readings from Last 3 Encounters:  05/03/15 101.2 kg (223 lb 1.7 oz)    General: Pleasant, obese, comfortable, non-ill appearing AAF. Head:  No facial swelling or asymmetry. No signs of head trauma.  Eyes:  Conjunctiva is pale. No scleral icterus. EOMI. Ears:  Somewhat hard of hearing.  Nose:  No discharge Mouth:  Only 3 or 4  teeth remain, these are incisors. Mucosa is pink and clear. Hydration slightly diminished. No obvious lesions or exudates. Neck:  No masses. No palpable goiter. No JVD. Lungs:  Clear bilaterally. No labored breathing. No cough. Heart: RRR. No MRG. S1/S2 audible. Abdomen:  Soft. Not tender. No distention. No obvious hernias, masses or organomegaly. Bowel sounds distant but active..   Rectal: Deferred rectal exam. Stool was brown, heme positive on yesterday's rectal exam.   Musc/Skeltl: No obvious joint deformities, contractures, redness. Extremities: Nonpitting edema of the feet/ankles.  Neurologic:  Patient is alert. She is oriented 3. She moves all 4 limbs thousand strength was not tested. No tremor. Skin:  No telangiectasias or sores. Tattoos:  None Nodes:  No cervical adenopathy.   Psych:  Pleasant, cooperative, calm.  Intake/Output from previous day: 02/21 0701 - 02/22 0700 In: 670 [Blood:670] Out: 450 [Urine:450] Intake/Output this shift:    LAB RESULTS:  Recent Labs  05/03/15 1915 05/03/15 2226 05/04/15 0702  WBC 9.5 9.5 8.4  HGB 5.2* 5.3* 7.5*  HCT 21.0* 21.0* 26.4*  PLT 412* 422* 361   BMET Lab Results  Component Value Date   NA 139 05/04/2015   NA 138 05/03/2015   NA 136 08/22/2012   K 3.9 05/04/2015   K 4.9 05/03/2015   K 4.1 08/22/2012   CL 105 05/04/2015   CL 102 05/03/2015   CL 100 08/22/2012   CO2 24 05/04/2015   CO2 24 05/03/2015   CO2 26 08/22/2012   GLUCOSE 130* 05/04/2015   GLUCOSE 200* 05/03/2015   GLUCOSE 259* 08/22/2012   BUN 7 05/04/2015   BUN 10 05/03/2015   BUN 16 08/22/2012   CREATININE 0.67 05/04/2015   CREATININE 0.80 05/03/2015   CREATININE 0.76 08/22/2012   CALCIUM 8.8* 05/04/2015   CALCIUM 9.3 05/03/2015   CALCIUM 9.9 08/22/2012   LFT  Recent Labs  05/03/15 1915  PROT 7.9  ALBUMIN 2.9*  AST 15  ALT 9*  ALKPHOS 78  BILITOT <0.1*   PT/INR No results found for: INR, PROTIME Hepatitis Panel No results for input(s):  HEPBSAG, HCVAB, HEPAIGM, HEPBIGM in the last 72 hours. C-Diff No components found for: CDIFF Lipase     Component Value Date/Time   LIPASE 37 08/22/2012 2020    Drugs of Abuse  No results found for: LABOPIA, COCAINSCRNUR, LABBENZ, AMPHETMU, THCU, LABBARB   RADIOLOGY STUDIES:  Dg Chest Port 1 View  05/04/2015  CLINICAL DATA:  Shortness of breath tonight. EXAM: PORTABLE CHEST 1 VIEW COMPARISON:  None. FINDINGS: Mild cardiomegaly. Aortic atherosclerosis. No pulmonary edema. Minimal subsegmental retrocardiac atelectasis. No confluent airspace disease. No pleural effusion or pneumothorax. IMPRESSION: Mild cardiomegaly.  No localizing process. Electronically Signed   By: Jeb Levering M.D.   On: 05/04/2015 02:03    ENDOSCOPIC STUDIES: None ever.   IMPRESSION:   *  Microcytic anemia (iron deficient) with FOBT + stool.  No overt bleeding. Rule out ulcer disease, rule out colonic neoplasm versus upper GI neoplasm. Rule out AVMs.  *  Hyperglycemia. Likely has type 2 diabetes that gone undiagnosed due to her not going to the doctor.  *  Limited mobility. Requires a walker to maintain her balance.  *  Obesity.   PLAN:     *  Colonoscopy +/minus EGD.  With MAC, 12:30 2/23.     *  Agree with empiric Protonix but doesn't need IV formulation. So I switched her to once daily oral.  *  Begin clear liquid diet. Ordered a couple of doses of Dulcolax. Split dose moviprep/    Azucena Freed  05/04/2015, 9:41 AM Pager: [phone]

## 2015-05-04 NOTE — Consult Note (Signed)
   [facility] CM Inpatient Consult   05/04/2015  Terry Abila Aug 09, 1925 [id number] Referral received for community follow up support from inpatient RNCM.  Patient was assessed for [facility] services and she is eligible through her Medicare insurance.  Met with the patient at the bedside. She states that she knows about the services and her daughter has gone for today but expected to be back later tonight.  She requested a contact card be left for her daughter.  This writer will follow up on 05/05/15 as she expects her daughter to be at the hospital around noon.  For questions, please contact: Natividad Brood, RN BSN [facility] Liaison  [phone] business mobile phone Toll free office [phone]

## 2015-05-05 ENCOUNTER — Inpatient Hospital Stay (HOSPITAL_COMMUNITY): Payer: Medicare Other | Admitting: Certified Registered Nurse Anesthetist

## 2015-05-05 ENCOUNTER — Encounter (HOSPITAL_COMMUNITY)
Admission: EM | Disposition: A | Discharge status: Home / Self Care | Payer: Self-pay | Source: Home / Self Care | Attending: Internal Medicine

## 2015-05-05 ENCOUNTER — Encounter (HOSPITAL_COMMUNITY): Payer: Self-pay | Admitting: *Deleted

## 2015-05-05 DIAGNOSIS — D120 Benign neoplasm of cecum: Secondary | ICD-10-CM

## 2015-05-05 DIAGNOSIS — Q2733 Arteriovenous malformation of digestive system vessel: Secondary | ICD-10-CM

## 2015-05-05 DIAGNOSIS — D125 Benign neoplasm of sigmoid colon: Secondary | ICD-10-CM

## 2015-05-05 HISTORY — PX: ESOPHAGOGASTRODUODENOSCOPY: SHX5428

## 2015-05-05 HISTORY — PX: COLONOSCOPY: SHX5424

## 2015-05-05 LAB — CBC
HCT: 31.6 % — ABNORMAL LOW (ref 36.0–46.0)
Hemoglobin: 9.1 g/dL — ABNORMAL LOW (ref 12.0–15.0)
MCH: 20.6 pg — ABNORMAL LOW (ref 26.0–34.0)
MCHC: 28.8 g/dL — AB (ref 30.0–36.0)
MCV: 71.5 fL — ABNORMAL LOW (ref 78.0–100.0)
Platelets: 340 10*3/uL (ref 150–400)
RBC: 4.42 MIL/uL (ref 3.87–5.11)
RDW: 23.4 % — AB (ref 11.5–15.5)
WBC: 8.6 10*3/uL (ref 4.0–10.5)

## 2015-05-05 LAB — TYPE AND SCREEN
ABO/RH(D): B POS
ANTIBODY SCREEN: NEGATIVE
Unit division: 0
Unit division: 0
Unit division: 0

## 2015-05-05 LAB — BASIC METABOLIC PANEL
Anion gap: 7 (ref 5–15)
BUN: <5 mg/dL — ABNORMAL LOW (ref 6–20)
CALCIUM: 8.9 mg/dL (ref 8.9–10.3)
CO2: 24 mmol/L (ref 22–32)
CREATININE: 0.63 mg/dL (ref 0.44–1.00)
Chloride: 111 mmol/L (ref 101–111)
Glucose, Bld: 145 mg/dL — ABNORMAL HIGH (ref 65–99)
Potassium: 4.4 mmol/L (ref 3.5–5.1)
SODIUM: 142 mmol/L (ref 135–145)

## 2015-05-05 LAB — HEMOGLOBIN A1C
HEMOGLOBIN A1C: 7.2 % — AB (ref 4.8–5.6)
MEAN PLASMA GLUCOSE: 160 mg/dL

## 2015-05-05 SURGERY — COLONOSCOPY
Anesthesia: Monitor Anesthesia Care

## 2015-05-05 MED ORDER — SODIUM CHLORIDE 0.9 % IV SOLN
100.0000 mg | Freq: Once | INTRAVENOUS | Status: AC
  Administered 2015-05-05: 100 mg via INTRAVENOUS
  Filled 2015-05-05 (×2): qty 2

## 2015-05-05 MED ORDER — BUTAMBEN-TETRACAINE-BENZOCAINE 2-2-14 % EX AERO
INHALATION_SPRAY | CUTANEOUS | Status: DC | PRN
  Administered 2015-05-05: 2 via TOPICAL

## 2015-05-05 MED ORDER — LIDOCAINE HCL (CARDIAC) 20 MG/ML IV SOLN
INTRAVENOUS | Status: DC | PRN
  Administered 2015-05-05: 40 mg via INTRAVENOUS

## 2015-05-05 MED ORDER — PROPOFOL 500 MG/50ML IV EMUL
INTRAVENOUS | Status: DC | PRN
  Administered 2015-05-05: 75 ug/kg/min via INTRAVENOUS

## 2015-05-05 MED ORDER — SODIUM CHLORIDE 0.9 % IV SOLN
25.0000 mg | Freq: Once | INTRAVENOUS | Status: AC
  Administered 2015-05-05: 25 mg via INTRAVENOUS
  Filled 2015-05-05: qty 0.5

## 2015-05-05 MED ORDER — SODIUM CHLORIDE 0.9 % IV SOLN
INTRAVENOUS | Status: DC
  Administered 2015-05-05: 08:00:00 via INTRAVENOUS

## 2015-05-05 MED ORDER — LIVING WELL WITH DIABETES BOOK
Freq: Once | Status: AC
  Administered 2015-05-05: 14:00:00
  Filled 2015-05-05: qty 1

## 2015-05-05 MED ORDER — PROPOFOL 10 MG/ML IV BOLUS
INTRAVENOUS | Status: DC | PRN
  Administered 2015-05-05: 20 mg via INTRAVENOUS

## 2015-05-05 MED ORDER — LACTATED RINGERS IV SOLN
INTRAVENOUS | Status: DC | PRN
  Administered 2015-05-05 (×2): via INTRAVENOUS

## 2015-05-05 NOTE — Progress Notes (Signed)
[facility] PROGRESS NOTE  Allison Choi [id number] DOB: 07-17-1925 DOA: 05/03/2015 PCP: Allison Peers, MD  Assessment/Plan: 1. Severe Iron defi anemia/Symptomatic anemia/hemoccult positive -suspect slow GI bleed, s/p 3units PRBC this admission -Hb 9.1 today -for EGD and Colonoscopy today, appreciate GI consult -will give Iv Iron later today  2. Hyperglycemia/New DM -hemoglobin A1c is 7.2 -DM education, will DC home on oral hypoglycemics  3. Obese/deconditioned -Pt/Ot eval completed  DVT Prophylaxis SCDs.   Code Status: DO NOT INTUBATE.  Family Communication: daughter at bedside Disposition Plan: home pending workup  Consultants:  [facility]    HPI/Subjective: Feels ok   Objective: Filed Vitals:   05/05/15 0447 05/05/15 1108  BP: 134/52 156/59  Pulse: 93 86  Temp: 99.1 F (37.3 C)   Resp: 18 26    Intake/Output Summary (Last 24 hours) at 05/05/15 1202 Last data filed at 05/05/15 0734  Gross per 24 hour  Intake   1290 ml  Output    450 ml  Net    840 ml   Filed Weights   05/03/15 2331  Weight: 101.2 kg (223 lb 1.7 oz)    Exam:   General: AAOx3  Cardiovascular:S1S2/RRR  Respiratory: CTAB  Abdomen: soft, Nt, BS present  Musculoskeletal: no edema c/c   Data Reviewed: Basic Metabolic Panel:  Recent Labs Lab 05/03/15 1915 05/04/15 0702 05/05/15 0740  NA 138 139 142  K 4.9 3.9 4.4  CL 102 105 111  CO2 24 24 24   GLUCOSE 200* 130* 145*  BUN 10 7 <5*  CREATININE 0.80 0.67 0.63  CALCIUM 9.3 8.8* 8.9   Liver Function Tests:  Recent Labs Lab 05/03/15 1915  AST 15  ALT 9*  ALKPHOS 78  BILITOT <0.1*  PROT 7.9  ALBUMIN 2.9*   No results for input(s): LIPASE, AMYLASE in the last 168 hours. No results for input(s): AMMONIA in the last 168 hours. CBC:  Recent Labs Lab 05/03/15 1915 05/03/15 2226 05/04/15 0702 05/05/15 0740  WBC 9.5 9.5 8.4 8.6  NEUTROABS 7.8* 7.1  --   --   HGB 5.2* 5.3* 7.5* 9.1*  HCT 21.0* 21.0*  26.4* 31.6*  MCV 64.0* 63.8* 67.5* 71.5*  PLT 412* 422* 361 340   Cardiac Enzymes: No results for input(s): CKTOTAL, CKMB, CKMBINDEX, TROPONINI in the last 168 hours. BNP (last 3 results) No results for input(s): BNP in the last 8760 hours.  ProBNP (last 3 results) No results for input(s): PROBNP in the last 8760 hours.  CBG: No results for input(s): GLUCAP in the last 168 hours.  No results found for this or any previous visit (from the past 240 hour(s)).   Studies: Dg Chest Port 1 View  05/04/2015  CLINICAL DATA:  Shortness of breath tonight. EXAM: PORTABLE CHEST 1 VIEW COMPARISON:  None. FINDINGS: Mild cardiomegaly. Aortic atherosclerosis. No pulmonary edema. Minimal subsegmental retrocardiac atelectasis. No confluent airspace disease. No pleural effusion or pneumothorax. IMPRESSION: Mild cardiomegaly.  No localizing process. Electronically Signed   By: Jeb Levering M.D.   On: 05/04/2015 02:03    Scheduled Meds: . [MAR Hold] sodium chloride   Intravenous Once  . living well with diabetes book   Does not apply Once  . [MAR Hold] pantoprazole  40 mg Oral Q0600   Continuous Infusions: . sodium chloride 20 mL/hr at 05/05/15 0811   Antibiotics Given (last 72 hours)    None      Principal Problem:   Symptomatic anemia Active Problems:   GI bleed  Hyperglycemia    Time spent: 97min    Allison Choi  [facility] Pager [phone]. If 7PM-7AM, please contact night-coverage at [email or web address], password [facility] 05/05/2015, 12:02 PM  LOS: 2 days

## 2015-05-05 NOTE — Op Note (Signed)
[facility] [place], [address]   COLONOSCOPY PROCEDURE REPORT  PATIENT: Allison Choi, Allison Choi  MR#: [id number] BIRTHDATE: Mar 23, 1925 , 89  yrs. old GENDER: female ENDOSCOPIST: Yetta Flock, MD REFERRED BY: PROCEDURE DATE:  05/05/2015 PROCEDURE:   Colonoscopy, diagnostic, Colonoscopy with snare polypectomy, and Colonoscopy with biopsy First Screening Colonoscopy - Avg.  risk and is 50 yrs.  old or older Yes.  Prior Negative Screening - Now for repeat screening. N/A  History of Adenoma - Now for follow-up colonoscopy & has been > or = to 3 yrs.  N/A  Polyps removed today? Yes ASA CLASS:   Class III INDICATIONS:iron deficiency anemia, no prior CRC screening. MEDICATIONS: Per Anesthesia  DESCRIPTION OF PROCEDURE:   After the risks benefits and alternatives of the procedure were thoroughly explained, informed consent was obtained.  The digital rectal exam revealed no abnormalities of the rectum.   The Pentax Ped Colon [id number] endoscope was introduced through the anus and advanced to the terminal ileum which was intubated for a short distance. No adverse events experienced.   The quality of the prep was adequate  The instrument was then slowly withdrawn as the colon was fully examined. Estimated blood loss is zero unless otherwise noted in this procedure report.  COLON FINDINGS: A 39mm flat polyp was noted in the cecum and removed with cold forceps.  A 7-86mm pedunculated polyp with some mild inflammatory changes was noted in the sigmoid colon and removed with hot snare.  There was moderate diverticulosis of the left colon and mild diverticulosis of the right colon.  Limited views of the ileum appeared normal.  The remainder of the colon was normal, no clear etiology for iron deficiency noted on this exam. Retroflexed views revealed internal hemorrhoids. The time to cecum = 3.7 Withdrawal time = 12.0   The scope was withdrawn and  the procedure completed. COMPLICATIONS: There were no immediate complications.  ENDOSCOPIC IMPRESSION: Moderate diverticulosis 2 polyps removed as above Internal hemorrhoids No clear etiology for iron deficiency on colonoscopy  RECOMMENDATIONS: Return to the medical ward Resume diet Resume medications No NSAIDs Start oral iron Consideration for capsule endoscopy as outpatient to clear the small bowel if Hgb remains stable while inpatient eSigned:  Yetta Flock, MD 05/05/2015 12:42 PM  cc:  the patient

## 2015-05-05 NOTE — Evaluation (Signed)
Physical Therapy Evaluation and Discharge Patient Details Name: Allison Choi MRN: [id number] DOB: 09/12/1925 Today's Date: 05/05/2015   History of Present Illness  Allison Choi is a 81 y.o. female with no significant past medical history has been experiencing fatigue weakness and shortness of breath on exertion over the last few weeks. Patient has been admitted for symptomatic anemia with possible chronic GI bleed.   Clinical Impression  Patient evaluated by Physical Therapy with no further acute PT needs identified. All education has been completed and the patient has no further questions. Patient reports she feels back to her baseline. SpO2 99% on room air. No dyspnea with gait. No overt loss of balance noted while using a rollator for support, which is what she uses at home for mobility. Good family support. See below for any follow-up Physical Therapy or equipment needs. PT is signing off. Thank you for this referral.     Follow Up Recommendations No PT follow up;Supervision - Intermittent    Equipment Recommendations  None recommended by PT    Recommendations for Other Services       Precautions / Restrictions Restrictions Weight Bearing Restrictions: No      Mobility  Bed Mobility               General bed mobility comments: Sitting EOB  Transfers Overall transfer level: Needs assistance Equipment used: None Transfers: Sit to/from Stand Sit to Stand: Supervision         General transfer comment: supervision for safety. Holds sink for support. No buckling noted. Good stability once rollator introduced  Ambulation/Gait Ambulation/Gait assistance: Supervision Ambulation Distance (Feet): 160 Feet Assistive device: 4-wheeled walker Gait Pattern/deviations: Step-through pattern;Decreased stride length;Antalgic Gait velocity: decreased   General Gait Details: Mildly antlagic gait pattern. Pt reports she feels that she is at her baseline with mobility. Light use  of rollator for support. No dyspnea or need for standing rest breaks during bout. Unable to obtain ambulatory SpO2 however after sitting and resting, dynamap reveals SpO2 of 99% on room air. No overt loss of balance noted with gait.  Stairs            Wheelchair Mobility    Modified Rankin (Stroke Patients Only)       Balance Overall balance assessment: Needs assistance Sitting-balance support: No upper extremity supported;Feet supported Sitting balance-Leahy Scale: Good     Standing balance support: No upper extremity supported Standing balance-Leahy Scale: Fair                               Pertinent Vitals/Pain Pain Assessment: No/denies pain    Home Living Family/patient expects to be discharged to:: Private residence Living Arrangements: Alone Available Help at Discharge: Family;Available PRN/intermittently Type of Home: House Home Access: Level entry     Home Layout: One level Home Equipment: Tub bench;Walker - 4 wheels      Prior Function Level of Independence: Independent with assistive device(s)         Comments: rollaotr for ambulation     Hand Dominance   Dominant Hand: Right    Extremity/Trunk Assessment   Upper Extremity Assessment: Defer to OT evaluation           Lower Extremity Assessment: Generalized weakness (Restricted Rt ankle dorsiflexion)         Communication   Communication: No difficulties  Cognition Arousal/Alertness: Awake/alert Behavior During Therapy: WFL for tasks assessed/performed Overall Cognitive Status: Within Functional  Limits for tasks assessed                      General Comments General comments (skin integrity, edema, etc.): Family present very supportive, plans to check in on pt frequently at d/c.    Exercises        Assessment/Plan    PT Assessment Patent does not need any further PT services  PT Diagnosis Abnormality of gait;Generalized weakness   PT Problem List     PT Treatment Interventions     PT Goals (Current goals can be found in the Care Plan section) Acute Rehab PT Goals Patient Stated Goal: Eat a ham biscuit PT Goal Formulation: All assessment and education complete, DC therapy    Frequency     Barriers to discharge        Co-evaluation               End of Session Equipment Utilized During Treatment: Gait belt Activity Tolerance: Patient tolerated treatment well Patient left: in bed;with bed alarm set;with family/visitor present (sitting EOB with alarm on) Nurse Communication:  (Unable to reach via telephone)         [id number] PT Time Calculation (min) (ACUTE ONLY): 15 min   Charges:   PT Evaluation $PT Eval Low Complexity: 1 Procedure     PT G CodesEllouise Newer 05/05/2015, 10:16 AM  Elayne Snare, [place]

## 2015-05-05 NOTE — Progress Notes (Signed)
  RD consulted for nutrition education regarding diabetes.   Lab Results  Component Value Date   HGBA1C 7.2* 05/03/2015   Case discussed with RN. She confirms that pt with newly diagnosed DM, who rarely visits her PCP office. Suspect compliance and commitment to lifestyle changes may be limited.   Per RN, pt is down in endoscopy at time of visit and won't be back for several hours. Per MD notes, pt will likely discharge home on oral hypoglycemic agents.  Attempted to see pt  X 3 and pt and/or family members were unavailable at all visits.   Body mass index is 39.53 kg/(m^2). Pt meets criteria for obesity, class II based on current BMI.  Current diet order is NPO, patient is consuming approximately n/a% of meals at this time. Labs and medications reviewed. No further nutrition interventions warranted at this time. If additional nutrition issues arise, please re-consult RD.  Kalub Morillo A. Jimmye Norman, RD, LDN, CDE Pager: [phone] After hours Pager: [phone]

## 2015-05-05 NOTE — Progress Notes (Signed)
Chaplain presented to the patient's room for follow up of an Advance Directive.  The patient was alert and capable to sign forms, daughter is also present. Chaplain contacted two witnesses, and Notary to complete the form. The original forms  were given to the patient, and copies were given to the daughter who was present.  Chaplain also offered a prayer of healing for the patient. Chaplain Yaakov Guthrie [phone]

## 2015-05-05 NOTE — Anesthesia Preprocedure Evaluation (Signed)
Anesthesia Evaluation  Patient identified by MRN, date of birth, ID band Patient awake    Reviewed: Allergy & Precautions, NPO status , Patient's Chart, lab work & pertinent test results  Airway Mallampati: II  TM Distance: >3 FB Neck ROM: Full    Dental   Pulmonary neg pulmonary ROS,    breath sounds clear to auscultation       Cardiovascular negative cardio ROS   Rhythm:Regular Rate:Normal     Neuro/Psych    GI/Hepatic negative GI ROS, Neg liver ROS,   Endo/Other  negative endocrine ROS  Renal/GU negative Renal ROS     Musculoskeletal   Abdominal   Peds  Hematology   Anesthesia Other Findings   Reproductive/Obstetrics                             Anesthesia Physical Anesthesia Plan  ASA: III  Anesthesia Plan: MAC   Post-op Pain Management:    Induction:   Airway Management Planned: Simple Face Mask  Additional Equipment:   Intra-op Plan:   Post-operative Plan:   Informed Consent: I have reviewed the patients History and Physical, chart, labs and discussed the procedure including the risks, benefits and alternatives for the proposed anesthesia with the patient or authorized representative who has indicated his/her understanding and acceptance.   Dental advisory given  Plan Discussed with: CRNA and Anesthesiologist  Anesthesia Plan Comments:         Anesthesia Quick Evaluation

## 2015-05-05 NOTE — Progress Notes (Addendum)
Inpatient Diabetes Program Recommendations  AACE/ADA: New Consensus Statement on Inpatient Glycemic Control (2015)  Target Ranges:  Prepandial:   less than 140 mg/dL      Peak postprandial:   less than 180 mg/dL (1-2 hours)      Critically ill patients:  140 - 180 mg/dL    Noted patient with no history of DM presenting with hyperglycemia and an A1c of 7.2% on 05/03/2015. Will see patient today to discuss diagnosis and watching her diet adn exercising. Will not recommend any other interventions due to age and if patient is living by herself. A1c at this level is where we want her to be without having hypoglycemia at home. Will order Living Well with Diabetes booklet in addition to ordering part of the educational videos.  Will follow.  Note A1c may not be accurate due to low Hgb on admission. Pt may just need to follow up with PCP for a repeat A1c in 2-3 months to reevaluate A1c. Would not recommend oral hypoglycemics at discharge.   Thanks,  Tama Headings RN, MSN, [facility] Inpatient Diabetes Coordinator Team Pager [phone] (8a-5p)

## 2015-05-05 NOTE — H&P (View-Only) (Signed)
[facility] Consult: 9:41 AM 05/04/2015  LOS: 1 day    Referring Provider: Dr Broadus John  Primary Care Physician:  Elyn Peers, MD (not seeing her regularly) Primary Gastroenterologist:  unassigned     Reason for Consultation:  FOBT +, microcytic anemia.    HPI: Allison Choi is a 82 y.o. female.  Patient lives independently at home with some paid help for cleaning and heavier work but she is able to cook her own meals.   Hx obstructing right ureteral and kidney stone 2014.  Diverticulosis on CT in 2014.  Left adrenal (adenoma?) on 2014 CT.   CAD on 2014 CT.  Obesity (BMI 39.6 currently).  Status post tubal ligation in the 1960s.    Came to urgent care 2/121 with at least a few weeks progressive fatigue, weakness, DOE.  Sent to ED with new onset anemia.   Hgb 5.2 (was 12.3 in 08/2012), MCV 64.  Ferritin 4. Glucose 200.  LFTs normal except low albumin at 2.9.  Cardiomegaly on xray.   No weight loss, melena, BPR, altered stool pattern/appearance.  For a few weeks the patient's appetite has been diminished but there is been no nausea or vomiting. She has no history of dysphagia. No significant history of abdominal pain or pyrosis.  No chest pain. No NSAIDs.  No ETOH.  She takes low-dose aspirin daily but no other NSAIDs.    History reviewed. No pertinent past medical history.  Past Surgical History  Procedure Laterality Date  . Tubal ligation      Prior to Admission medications   Medication Sig Start Date End Date Taking? Authorizing Provider  aspirin EC 81 MG tablet Take 162 mg by mouth daily.   Yes Historical Provider, MD           Scheduled Meds: . sodium chloride   Intravenous Once  . pantoprazole (PROTONIX) IV  40 mg Intravenous Q12H   Infusions:   PRN Meds: acetaminophen **OR** acetaminophen,  ondansetron **OR** ondansetron (ZOFRAN) IV   Allergies as of 05/03/2015  . (No Known Allergies)    Family History  Problem Relation Age of Onset  . Diabetes Mellitus II Son     Social History   Social History  . Marital Status: Divorced    Spouse Name: N/A  . Number of Children: N/A  . Years of Education: N/A   Occupational History  . Not on file.   Social History Main Topics  . Smoking status: Never Smoker   . Smokeless tobacco: Not on file  . Alcohol Use: No  . Drug Use: No  . Sexual Activity: Not Currently   Other Topics Concern  . Not on file   Social History Narrative    REVIEW OF SYSTEMS: Constitutional:  Patient is dependent on a walker for mobility. She had a mishap with some furniture at the house about a month ago and fell. Her mobility since then has been limited. ENT:  No nose bleeds Pulm:  DOE. No cough. No pleuritic pain. CV:  No palpitations, no LE edema. No chest pain  GU:  No hematuria, no frequency.  No hematuria. GI:  Per HPI Heme:  No issues with excessive bleeding or bruising. No previous issues with low blood counts.   Transfusions:  Was never transfused before last night. Neuro:  No headaches, no peripheral tingling or numbness Derm:  No itching, no rash or sores.  Endocrine:  No sweats or chills.  No polyuria or dysuria Immunization:  Not current on immunizations. Travel:  None beyond local counties in last few months.    PHYSICAL EXAM: Vital signs in last 24 hours: Filed Vitals:   05/04/15 0421 05/04/15 0536  BP: 141/61 109/69  Pulse: 101 100  Temp: 98.1 F (36.7 C) 98.1 F (36.7 C)  Resp: 20 18   Wt Readings from Last 3 Encounters:  05/03/15 101.2 kg (223 lb 1.7 oz)    General: Pleasant, obese, comfortable, non-ill appearing AAF. Head:  No facial swelling or asymmetry. No signs of head trauma.  Eyes:  Conjunctiva is pale. No scleral icterus. EOMI. Ears:  Somewhat hard of hearing.  Nose:  No discharge Mouth:  Only 3 or 4  teeth remain, these are incisors. Mucosa is pink and clear. Hydration slightly diminished. No obvious lesions or exudates. Neck:  No masses. No palpable goiter. No JVD. Lungs:  Clear bilaterally. No labored breathing. No cough. Heart: RRR. No MRG. S1/S2 audible. Abdomen:  Soft. Not tender. No distention. No obvious hernias, masses or organomegaly. Bowel sounds distant but active..   Rectal: Deferred rectal exam. Stool was brown, heme positive on yesterday's rectal exam.   Musc/Skeltl: No obvious joint deformities, contractures, redness. Extremities: Nonpitting edema of the feet/ankles.  Neurologic:  Patient is alert. She is oriented 3. She moves all 4 limbs thousand strength was not tested. No tremor. Skin:  No telangiectasias or sores. Tattoos:  None Nodes:  No cervical adenopathy.   Psych:  Pleasant, cooperative, calm.  Intake/Output from previous day: 02/21 0701 - 02/22 0700 In: 670 [Blood:670] Out: 450 [Urine:450] Intake/Output this shift:    LAB RESULTS:  Recent Labs  05/03/15 1915 05/03/15 2226 05/04/15 0702  WBC 9.5 9.5 8.4  HGB 5.2* 5.3* 7.5*  HCT 21.0* 21.0* 26.4*  PLT 412* 422* 361   BMET Lab Results  Component Value Date   NA 139 05/04/2015   NA 138 05/03/2015   NA 136 08/22/2012   K 3.9 05/04/2015   K 4.9 05/03/2015   K 4.1 08/22/2012   CL 105 05/04/2015   CL 102 05/03/2015   CL 100 08/22/2012   CO2 24 05/04/2015   CO2 24 05/03/2015   CO2 26 08/22/2012   GLUCOSE 130* 05/04/2015   GLUCOSE 200* 05/03/2015   GLUCOSE 259* 08/22/2012   BUN 7 05/04/2015   BUN 10 05/03/2015   BUN 16 08/22/2012   CREATININE 0.67 05/04/2015   CREATININE 0.80 05/03/2015   CREATININE 0.76 08/22/2012   CALCIUM 8.8* 05/04/2015   CALCIUM 9.3 05/03/2015   CALCIUM 9.9 08/22/2012   LFT  Recent Labs  05/03/15 1915  PROT 7.9  ALBUMIN 2.9*  AST 15  ALT 9*  ALKPHOS 78  BILITOT <0.1*   PT/INR No results found for: INR, PROTIME Hepatitis Panel No results for input(s):  HEPBSAG, HCVAB, HEPAIGM, HEPBIGM in the last 72 hours. C-Diff No components found for: CDIFF Lipase     Component Value Date/Time   LIPASE 37 08/22/2012 2020    Drugs of Abuse  No results found for: LABOPIA, COCAINSCRNUR, LABBENZ, AMPHETMU, THCU, LABBARB   RADIOLOGY STUDIES:  Dg Chest Port 1 View  05/04/2015  CLINICAL DATA:  Shortness of breath tonight. EXAM: PORTABLE CHEST 1 VIEW COMPARISON:  None. FINDINGS: Mild cardiomegaly. Aortic atherosclerosis. No pulmonary edema. Minimal subsegmental retrocardiac atelectasis. No confluent airspace disease. No pleural effusion or pneumothorax. IMPRESSION: Mild cardiomegaly.  No localizing process. Electronically Signed   By: Jeb Levering M.D.   On: 05/04/2015 02:03    ENDOSCOPIC STUDIES: None ever.   IMPRESSION:   *  Microcytic anemia (iron deficient) with FOBT + stool.  No overt bleeding. Rule out ulcer disease, rule out colonic neoplasm versus upper GI neoplasm. Rule out AVMs.  *  Hyperglycemia. Likely has type 2 diabetes that gone undiagnosed due to her not going to the doctor.  *  Limited mobility. Requires a walker to maintain her balance.  *  Obesity.   PLAN:     *  Colonoscopy +/minus EGD.  With MAC, 12:30 2/23.     *  Agree with empiric Protonix but doesn't need IV formulation. So I switched her to once daily oral.  *  Begin clear liquid diet. Ordered a couple of doses of Dulcolax. Split dose moviprep/    Azucena Freed  05/04/2015, 9:41 AM Pager: [phone]

## 2015-05-05 NOTE — Interval H&P Note (Signed)
History and Physical Interval Note:  05/05/2015 11:40 AM  Allison Choi  has presented today for surgery, with the diagnosis of FOBT + and iron def anemia.  The various methods of treatment have been discussed with the patient and family. After consideration of risks, benefits and other options for treatment, the patient has consented to  Procedure(s): COLONOSCOPY (N/A) ESOPHAGOGASTRODUODENOSCOPY (EGD) (N/A) as a surgical intervention .  The patient's history has been reviewed, patient examined, no change in status, stable for surgery.  I have reviewed the patient's chart and labs.  Questions were answered to the patient's satisfaction.     Renelda Loma Roby [place]

## 2015-05-05 NOTE — Progress Notes (Signed)
Paged Dr. Broadus John. Pt to be on a carb modified diet and to start diabetic teaching as pt is probably diabetic. Will need follow-up A1C to confirm.

## 2015-05-05 NOTE — Op Note (Signed)
[facility] [place], [address]   ENDOSCOPY PROCEDURE REPORT  PATIENT: Allison, Choi  MR#: [id number] BIRTHDATE: September 27, 1925 , 89  yrs. old GENDER: female ENDOSCOPIST: Yetta Flock, MD REFERRED BY: PROCEDURE DATE:  05/05/2015 PROCEDURE:  EGD w/ ablation ASA CLASS:     Class III INDICATIONS:  iron deficiency anemia, (+) FOBT. MEDICATIONS: Per Anesthesia TOPICAL ANESTHETIC: Lidocaine Spray  DESCRIPTION OF PROCEDURE: After the risks benefits and alternatives of the procedure were thoroughly explained, informed consent was obtained.  The Pentax Gastroscope [id number] endoscope was introduced through the mouth and advanced to the second portion of the duodenum , Without limitations.  The instrument was slowly withdrawn as the mucosa was fully examined.   FINDINGS: The esophagus was normal.  DH, GEJ, and SCJ located 38cm from the incisors.  The stomach was normal in appearance without inflammatory changes, ulcerations, or mass lesions.  The duodenal bulb was normal.  A moderate sized AVM was noted in the 2nd portion of the duodenum and ablated with the APC catheter with good result. A small AVM was also noted in the 2nd portion of the duodenum and ablated with APC catheter with good result.  Retroflexed views revealed no abnormalities.     The scope was then withdrawn from the patient and the procedure completed.  COMPLICATIONS: There were no immediate complications.  ENDOSCOPIC IMPRESSION: Normal esophagus and stomach 2 duodenal AVMs, ablated as above Overall, anemia could be related to AVMs, and it is possible there are additional AVMs more distally in the small bowel    RECOMMENDATIONS: Return to medical ward Resume diet Resume medications No NSAIDs Start oral iron supplement Consideration for capsule endoscopy as outpatient, follow up in the GI clinic in a few weeks for reassessment    eSigned:  Yetta Flock, MD  05/05/2015 12:49 PM    CC: the patient  PATIENT NAME:  Allison, Choi MR#: [id number]

## 2015-05-05 NOTE — Progress Notes (Signed)
OT Cancellation Note  Patient Details Name: Allison Choi MRN: [id number] DOB: 13-Jan-1926   Cancelled Treatment:    Reason Eval/Treat Not Completed: Patient at procedure or test/ unavailable. Pt having in procedure for colonoscopy--will attempt eval at later time.  Almon Register [id number] 05/05/2015, 12:46 PM

## 2015-05-05 NOTE — Transfer of Care (Signed)
Immediate Anesthesia Transfer of Care Note  Patient: Allison Choi  Procedure(s) Performed: Procedure(s): COLONOSCOPY (N/A) ESOPHAGOGASTRODUODENOSCOPY (EGD) (N/A)  Patient Location: PACU  Anesthesia Type:MAC  Level of Consciousness: awake, alert  and oriented  Airway & Oxygen Therapy: Patient Spontanous Breathing and Patient connected to nasal cannula oxygen  Post-op Assessment: Report given to RN and Post -op Vital signs reviewed and stable  Post vital signs: Reviewed and stable  Last Vitals:  Filed Vitals:   05/05/15 1108 05/05/15 1240  BP: 156/59 144/57  Pulse: 86 93  Temp:    Resp: 26 22    Complications: No apparent anesthesia complications

## 2015-05-05 NOTE — Progress Notes (Signed)
Discussed and watched videos 501-Basic skills for controlling diabetes as well as video 0000000 Long Term Complications with the patient and her  daughter. All question were answered.

## 2015-05-05 NOTE — Progress Notes (Signed)
Pt and daughter given teaching on all diabetic videos [id number]. No further questions at this time

## 2015-05-06 ENCOUNTER — Encounter (HOSPITAL_COMMUNITY): Payer: Self-pay | Admitting: Gastroenterology

## 2015-05-06 LAB — CBC
HEMATOCRIT: 32.4 % — AB (ref 36.0–46.0)
HEMOGLOBIN: 9.1 g/dL — AB (ref 12.0–15.0)
MCH: 19.7 pg — AB (ref 26.0–34.0)
MCHC: 28.1 g/dL — AB (ref 30.0–36.0)
MCV: 70.1 fL — ABNORMAL LOW (ref 78.0–100.0)
Platelets: 366 10*3/uL (ref 150–400)
RBC: 4.62 MIL/uL (ref 3.87–5.11)
RDW: 24.4 % — ABNORMAL HIGH (ref 11.5–15.5)
WBC: 9.4 10*3/uL (ref 4.0–10.5)

## 2015-05-06 LAB — BASIC METABOLIC PANEL
ANION GAP: 8 (ref 5–15)
BUN: 7 mg/dL (ref 6–20)
CHLORIDE: 109 mmol/L (ref 101–111)
CO2: 21 mmol/L — AB (ref 22–32)
Calcium: 8.8 mg/dL — ABNORMAL LOW (ref 8.9–10.3)
Creatinine, Ser: 0.65 mg/dL (ref 0.44–1.00)
GFR calc non Af Amer: >60 mL/min (ref >60)
Glucose, Bld: 120 mg/dL — ABNORMAL HIGH (ref 65–99)
POTASSIUM: 3.9 mmol/L (ref 3.5–5.1)
Sodium: 138 mmol/L (ref 135–145)

## 2015-05-06 MED ORDER — FERROUS SULFATE 325 (65 FE) MG PO TABS: 325.0000 mg | ORAL_TABLET | Freq: Two times a day (BID) | ORAL | Status: AC

## 2015-05-06 NOTE — Clinical Documentation Improvement (Signed)
Internal Medicine  A cause and effect relationship may not be assumed and must be documented by a provider.  Please clarify the relationship, if any, between  GI bleed and  EGD and/or Colonoscopy findings.  Are the conditions:   Due to or associated with each other  Unrelated to each other  Other  Clinically Undetermined  Supporting Information (risk factors, sign and symptoms, diagnostics, treatment):  EGD>impression: 2 duodenal AVMs ablated Colonoscopy> polypectomy x 2 areas   Please exercise your independent, professional judgment when responding. A specific answer is not anticipated or expected. Please update your documentation within the medical record to reflect your response to this query. Thank you  Thank You,  [place] [phone]

## 2015-05-06 NOTE — Plan of Care (Signed)
Problem: Food- and Nutrition-Related Knowledge Deficit (NB-1.1) Goal: Nutrition education Formal process to instruct or train a patient/client in a skill or to impart knowledge to help patients/clients voluntarily manage or modify food choices and eating behavior to maintain or improve health. Outcome: Adequate for Discharge  RD consulted for nutrition education regarding diabetes.     Lab Results  Component Value Date    HGBA1C 7.2* 05/03/2015   Case discussed with RN. Confirms that pt will follow-up with PCP for glycemic control issues. Pt and pt daughter watched DM educational videos yesterday.   Spoke with pt at bedside. She reports that she generally consumes 2 meals per day, comprising of a meat, starch, and vegetables. She snacks on fruit, potato chips, and pork skins. She enjoys eating vegetables and shares that she consumes more vegetables than other food groups in her diet. Pt daughter was also present for education session. Plan will likely be to d/c later today, per discussion with GI PA.   RD provided "Carbohydrate Counting for People with Diabetes" handout from the Academy of Nutrition and Dietetics. Discussed different food groups and their effects on blood sugar, emphasizing carbohydrate-containing foods. Provided list of carbohydrates and recommended serving sizes of common foods.  Discussed importance of controlled and consistent carbohydrate intake throughout the day. Provided examples of ways to balance meals/snacks and encouraged intake of high-fiber, whole grain complex carbohydrates. Teach back method used.  Expect fair compliance.  Body mass index is 39.53 kg/(m^2). Pt meets criteria for obesity, class II based on current BMI.  Current diet order is Carb Modified, patient is consuming approximately n/a% of meals at this time. Labs and medications reviewed. No further nutrition interventions warranted at this time. RD contact information provided. If additional nutrition  issues arise, please re-consult RD.  Nelline Lio A. Jimmye Norman, RD, LDN, CDE Pager: [phone] After hours Pager: [phone]

## 2015-05-06 NOTE — Care Management Note (Signed)
Case Management Note  Patient Details  Name: Arynn Cerreta MRN: [id number] Date of Birth: February 06, 1926  Subjective/Objective:                 Patient much improved, no needs for [facility] at this time. DME ordered.   Action/Plan:  Will DC to home today with daughter.   Expected Discharge Date:                  Expected Discharge Plan:  [place]  In-House Referral:     Discharge planning Services  CM Consult  Post Acute Care Choice:  Durable Medical Equipment, Home Health Choice offered to:  Patient, Adult Children  DME Arranged:  Walker rolling with seat, Shower stool DME Agency:  [place]:    [place]:     Status of Service:  Completed, signed off  Medicare Important Message Given:    Date Medicare IM Given:    Medicare IM give by:    Date Additional Medicare IM Given:    Additional Medicare Important Message give by:     If discussed at [place] of Stay Meetings, dates discussed:    Additional Comments:  Carles Collet, RN 05/06/2015, 11:22 AM

## 2015-05-06 NOTE — Discharge Summary (Signed)
Physician Discharge Summary  Allison Choi [id number] DOB: [birth year] DOA: 05/03/2015  PCP: Elyn Peers, MD  Admit date: 05/03/2015 Discharge date: 05/06/2015  Time spent:45 minutes  Recommendations for Outpatient Follow-up:  1. Dr.Steven Armbruster 3/10, FU Biopsy, monitor Hb 2. PCP Dr.Boyd in 1 month, please repeat [facility] in 1-4months  Discharge Diagnoses:  Principal Problem:   Symptomatic anemia   Duodenal AVMs   Diverticulosis   Colon polyps s/p resection   GI bleed   Hyperglycemia   Diabetes Mellitus  Discharge Condition: stable  Diet recommendation: carb modified  Filed Weights   05/03/15 2331  Weight: 101.2 kg (223 lb 1.7 oz)    History of present illness:  Chief Complaint: Weakness and shortness of breath.  HPI: Allison Choi is a 82 y.o. female with no significant past medical history had been experiencing fatigue weakness and shortness of breath on exertion over the last few weeks. Patient had followed up with urgent care and was found to have hemoglobin around 5 which was confirmed in the ER. Patient denied having any weight loss blood in the stools chest pain. Stool for occult blood was positive but not grossly melanotic  Hospital Course:  1. Severe Iron defi anemia/Symptomatic anemia/hemoccult positive -s/p 3units PRBC this admission, Hb improved to 9 at discharge -s/p EGD and Colonoscopy yesterday, AVMs noted in duodenum s/p ablation and colonoscopy s/p polypectomy and notable for diverticulosis. -she was given IV Iron yesterday -will FU with Gi next month, FU biopsy -started on oral iron  2. Hyperglycemia/New DM -hemoglobin A1c is 7.2 -DM education, will DC home on oral hypoglycemics  3. Obese/deconditioned -Pt/Ot eval completed   Procedures:  EGD Normal esophagus and stomach 2 duodenal AVMs, ablated     Colonoscopy:ENDOSCOPIC IMPRESSION: Moderate diverticulosis 2 polyps removed as above Internal hemorrhoids  Consultations:  Gi  Dr.Armbruster  Discharge Exam: Filed Vitals:   05/05/15 2053 05/06/15 0536  BP: 143/66 134/61  Pulse: 98 94  Temp: 99.8 F (37.7 C) 98.6 F (37 C)  Resp: 18 16    General: AAOx3 Cardiovascular:S1S2/RRR Respiratory: CTAB  Discharge Instructions   Discharge Instructions    Diet - low sodium heart healthy    Complete by:  As directed      Increase activity slowly    Complete by:  As directed           Current Discharge Medication List    START taking these medications   Details  ferrous sulfate (FERROUSUL) 325 (65 FE) MG tablet Take 1 tablet (325 mg total) by mouth 2 (two) times daily with a meal. Qty: 30 tablet, Refills: 1      STOP taking these medications     aspirin EC 81 MG tablet      oxyCODONE-acetaminophen (PERCOCET/ROXICET) 5-325 MG per tablet        No Known Allergies Follow-up Information    Follow up with [facility].   Why:  Rolator and shower stool to be delivered to room prior to discharge   Contact information:   [address] [place] [address] [phone]       Follow up with Manus Gunning, MD On 06/09/2015.   Specialty:  Gastroenterology   Why:  11 AM for GI follow up.    Contact information:   [place] [address] [phone]       Follow up with Bartholome Bill, MD In 1 month.   Specialty:  Family Medicine  Why:  please check [facility] at [facility] information:   [address] [place] [address] [phone]        The results of significant diagnostics from this hospitalization (including imaging, microbiology, ancillary and laboratory) are listed below for reference.    Significant Diagnostic Studies: Dg Chest Port 1 View  05/04/2015  CLINICAL DATA:  Shortness of breath tonight. EXAM: PORTABLE CHEST 1 VIEW COMPARISON:  None. FINDINGS: Mild cardiomegaly. Aortic atherosclerosis. No pulmonary edema. Minimal subsegmental retrocardiac atelectasis. No  confluent airspace disease. No pleural effusion or pneumothorax. IMPRESSION: Mild cardiomegaly.  No localizing process. Electronically Signed   By: Jeb Levering M.D.   On: 05/04/2015 02:03    Microbiology: No results found for this or any previous visit (from the past 240 hour(s)).   Labs: Basic Metabolic Panel:  Recent Labs Lab 05/03/15 1915 05/04/15 0702 05/05/15 0740 05/06/15 0733  NA 138 139 142 138  K 4.9 3.9 4.4 3.9  CL 102 105 111 109  CO2 24 24 24  21*  GLUCOSE 200* 130* 145* 120*  BUN 10 7 <5* 7  CREATININE 0.80 0.67 0.63 0.65  CALCIUM 9.3 8.8* 8.9 8.8*   Liver Function Tests:  Recent Labs Lab 05/03/15 1915  AST 15  ALT 9*  ALKPHOS 78  BILITOT <0.1*  PROT 7.9  ALBUMIN 2.9*   No results for input(s): LIPASE, AMYLASE in the last 168 hours. No results for input(s): AMMONIA in the last 168 hours. CBC:  Recent Labs Lab 05/03/15 1915 05/03/15 2226 05/04/15 0702 05/05/15 0740 05/06/15 0733  WBC 9.5 9.5 8.4 8.6 9.4  NEUTROABS 7.8* 7.1  --   --   --   HGB 5.2* 5.3* 7.5* 9.1* 9.1*  HCT 21.0* 21.0* 26.4* 31.6* 32.4*  MCV 64.0* 63.8* 67.5* 71.5* 70.1*  PLT 412* 422* 361 340 366   Cardiac Enzymes: No results for input(s): CKTOTAL, CKMB, CKMBINDEX, TROPONINI in the last 168 hours. BNP: BNP (last 3 results) No results for input(s): BNP in the last 8760 hours.  ProBNP (last 3 results) No results for input(s): PROBNP in the last 8760 hours.  CBG: No results for input(s): GLUCAP in the last 168 hours.     SignedDomenic Polite MD.  [facility] 05/06/2015, 10:54 AM

## 2015-05-06 NOTE — Progress Notes (Signed)
          Daily Rounding Note  05/06/2015, 10:14 AM  LOS: 3 days   SUBJECTIVE:       No complaints.  Ready to discharge home.  RD in room counselling pt re carb mod diet.   OBJECTIVE:         Vital signs in last 24 hours:    Temp:  [98.6 F (37 C)-99.8 F (37.7 C)] 98.6 F (37 C) (02/24 0536) Pulse Rate:  [86-98] 94 (02/24 0536) Resp:  [16-26] 16 (02/24 0536) BP: (126-168)/(57-75) 134/61 mmHg (02/24 0536) SpO2:  [94 %-100 %] 94 % (02/24 0536) Last BM Date: 05/05/15 Filed Weights   05/03/15 2331  Weight: 101.2 kg (223 lb 1.7 oz)   General: obese.  NAD   Heart: RRR Chest: clear bil.  No dyspnea Abdomen: obese, active BS, NT, ND  Extremities: non-pitting obesity/swelling Neuro/Psych:  Oriented x 3.  No gross deficits.   Intake/Output from previous day: 02/23 0701 - 02/24 0700 In: 240 [P.O.:240] Out: 251 [Urine:250; Stool:1]  Intake/Output this shift:    Lab Results:  Recent Labs  05/04/15 0702 05/05/15 0740 05/06/15 0733  WBC 8.4 8.6 9.4  HGB 7.5* 9.1* 9.1*  HCT 26.4* 31.6* 32.4*  PLT 361 340 366   BMET  Recent Labs  05/04/15 0702 05/05/15 0740 05/06/15 0733  NA 139 142 138  K 3.9 4.4 3.9  CL 105 111 109  CO2 24 24 21*  GLUCOSE 130* 145* 120*  BUN 7 <5* 7  CREATININE 0.67 0.63 0.65  CALCIUM 8.8* 8.9 8.8*   LFT  Recent Labs  05/03/15 1915  PROT 7.9  ALBUMIN 2.9*  AST 15  ALT 9*  ALKPHOS 78  BILITOT <0.1*   PT/INR No results for input(s): LABPROT, INR in the last 72 hours. Hepatitis Panel No results for input(s): HEPBSAG, HCVAB, HEPAIGM, HEPBIGM in the last 72 hours.  Studies/Results: No results found.  ASSESMENT:   * Microcytic anemia (iron deficient) with FOBT + stool. No overt bleeding.  EGD 05/05/15: Duodenal AVMs ablated.  These (and ? others deeper in SB) may be source of anemia, FOBT + (along with colon polyps).  Colonoscopy 05/05/15: diverticulosis, internal rrhoids, cecal  and sigmoid polypectomy: path pndg.  Parenteral iron infusion completed 2/23.   * Hyperglycemia. Likely has type 2 diabetes that gone undiagnosed due to her not going to the doctor.  * Limited mobility. Requires a walker to maintain her balance.  * Obesity.    PLAN   * GI signing off.  Pt ok for discharge from GI perspective.  Has GI ROV 3/30 with Dr A. Would continue po iron once daily.       Allison Choi  05/06/2015, 10:14 AM Pager: [phone]

## 2015-05-06 NOTE — Progress Notes (Signed)
Nsg Discharge Note  Admit Date:  05/03/2015 Discharge date: 05/06/2015   Brittani Glidewell to be D/C'd Home per MD order.  AVS completed.  Copy for chart, and copy for patient signed, and dated. Patient/caregiver able to verbalize understanding.  Discharge Medication:   Medication List    STOP taking these medications        aspirin EC 81 MG tablet     oxyCODONE-acetaminophen 5-325 MG tablet  Commonly known as:  PERCOCET/ROXICET      TAKE these medications        ferrous sulfate 325 (65 FE) MG tablet  Commonly known as:  FERROUSUL  Take 1 tablet (325 mg total) by mouth 2 (two) times daily with a meal.        Discharge Assessment: Filed Vitals:   05/05/15 2053 05/06/15 0536  BP: 143/66 134/61  Pulse: 98 94  Temp: 99.8 F (37.7 C) 98.6 F (37 C)  Resp: 18 16   Skin clean, dry and intact without evidence of skin break down, no evidence of skin tears noted. IV catheter discontinued intact. Site without signs and symptoms of complications - no redness or edema noted at insertion site, patient denies c/o pain - only slight tenderness at site.  Dressing with slight pressure applied.  D/c Instructions-Education: Discharge instructions given to patient/family with verbalized understanding. D/c education completed with patient/family including follow up instructions, medication list, d/c activities limitations if indicated, with other d/c instructions as indicated by MD - patient able to verbalize understanding, all questions fully answered. Patient instructed to return to ED, call 911, or call MD for any changes in condition.  Patient escorted via [place], and D/C home via private auto.  Sharley Keeler Margaretha Sheffield, RN 05/06/2015 12:14 PM

## 2015-05-06 NOTE — Care Management Important Message (Signed)
Important Message  Patient Details  Name: Allison Choi MRN: [id number] Date of Birth: [birth year]   Medicare Important Message Given:  Yes    Nathen May 05/06/2015, 11:41 AM

## 2015-05-11 ENCOUNTER — Other Ambulatory Visit: Payer: Self-pay | Admitting: *Deleted

## 2015-05-11 MED ORDER — OMEPRAZOLE 20 MG PO CPDR: DELAYED_RELEASE_CAPSULE | ORAL | Status: DC

## 2015-05-11 MED ORDER — BIS SUBCIT-METRONID-TETRACYC 140-125-125 MG PO CAPS: 3.0000 | ORAL_CAPSULE | Freq: Three times a day (TID) | ORAL | Status: DC

## 2015-05-17 NOTE — Anesthesia Postprocedure Evaluation (Signed)
Anesthesia Post Note  Patient: Allison Choi  Procedure(s) Performed: Procedure(s) (LRB): COLONOSCOPY (N/A) ESOPHAGOGASTRODUODENOSCOPY (EGD) (N/A)  Patient location during evaluation: Endoscopy Anesthesia Type: MAC Level of consciousness: awake Respiratory status: spontaneous breathing Cardiovascular status: stable Anesthetic complications: no    Last Vitals:  Filed Vitals:   05/05/15 2053 05/06/15 0536  BP: 143/66 134/61  Pulse: 98 94  Temp: 37.7 C 37 C  Resp: 18 16    Last Pain:  Filed Vitals:   05/06/15 0914  PainSc: 0-No pain                 EDWARDS,Sharlize Hoar

## 2015-05-22 IMAGING — US US ABDOMEN LIMITED
1 series · 14 of 25 positions shown · non-contrast
Comparison: None.

CLINICAL DATA: Epigastric pain, nausea.

LIMITED ABDOMINAL ULTRASOUND - RIGHT UPPER QUADRANT

[Series 1: us abdomen limited · 0.30mm/px · 14 of 40 slices shown]
[im 1/40]
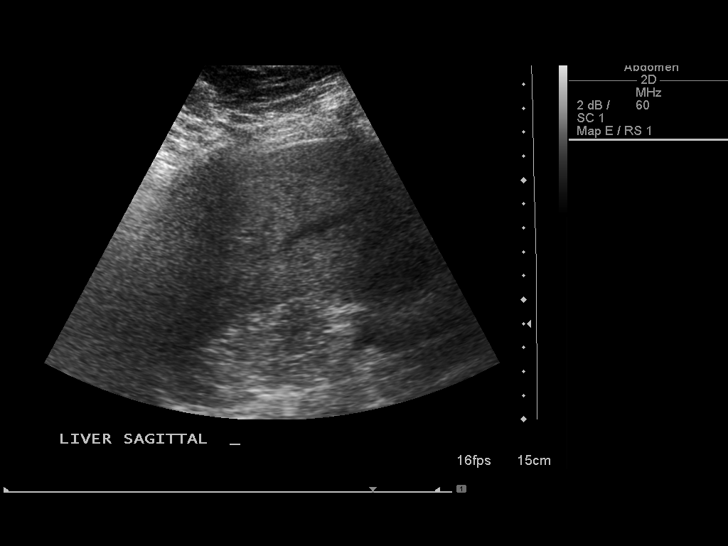
[im 4/40]
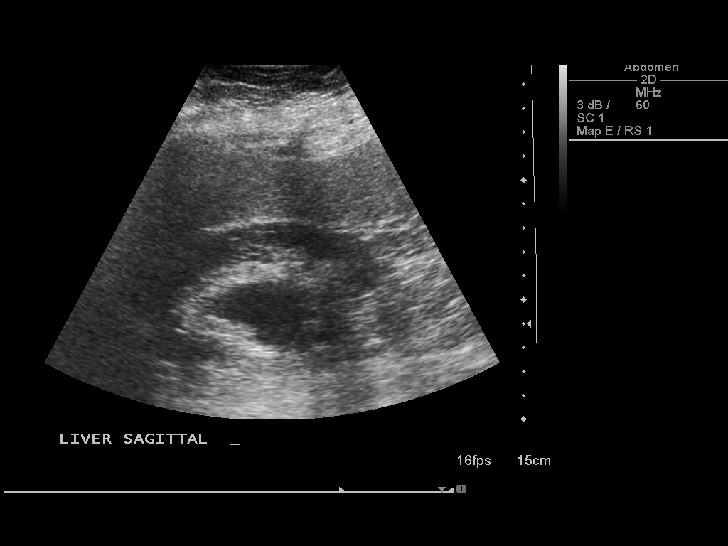
[im 7/40]
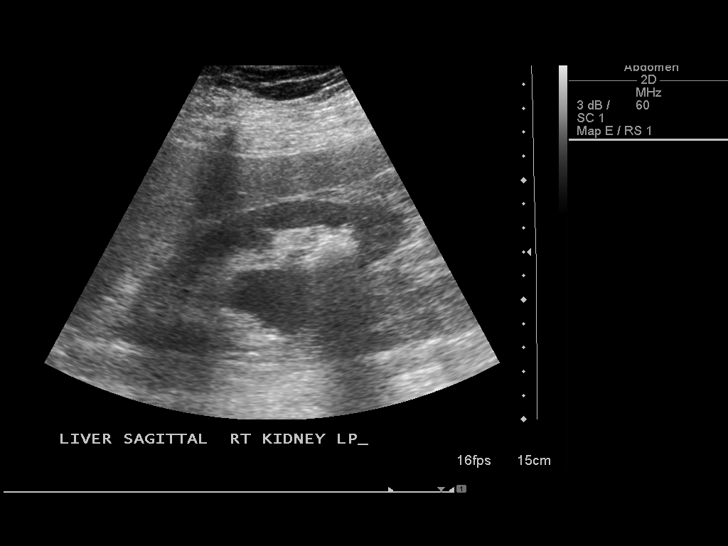
[im 10/40]
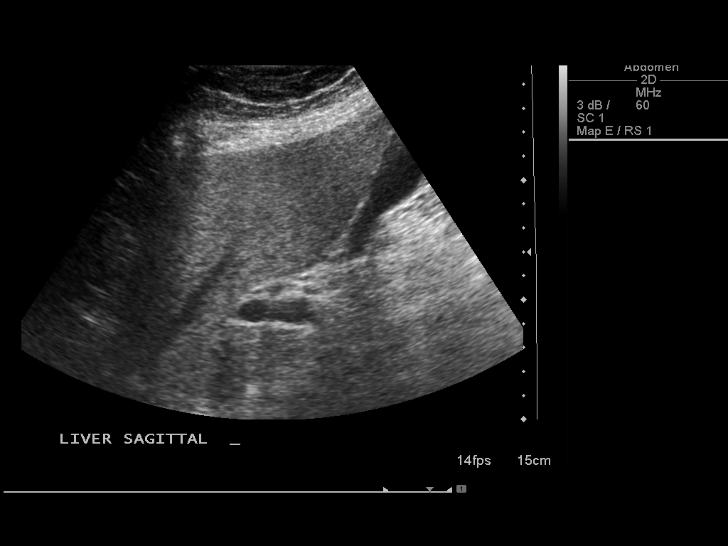
[im 14/40]
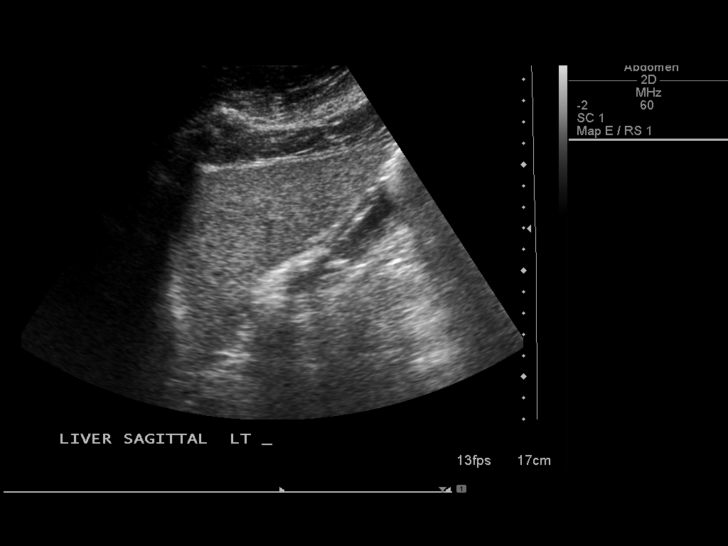
[im 15/40]
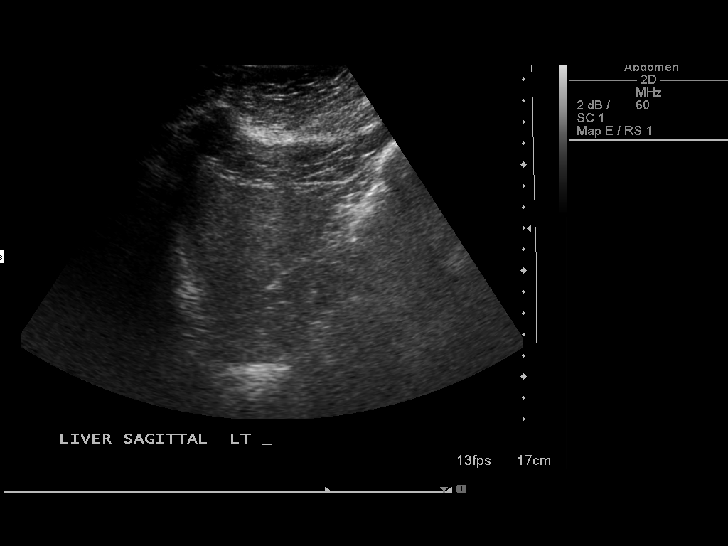
[im 18/40]
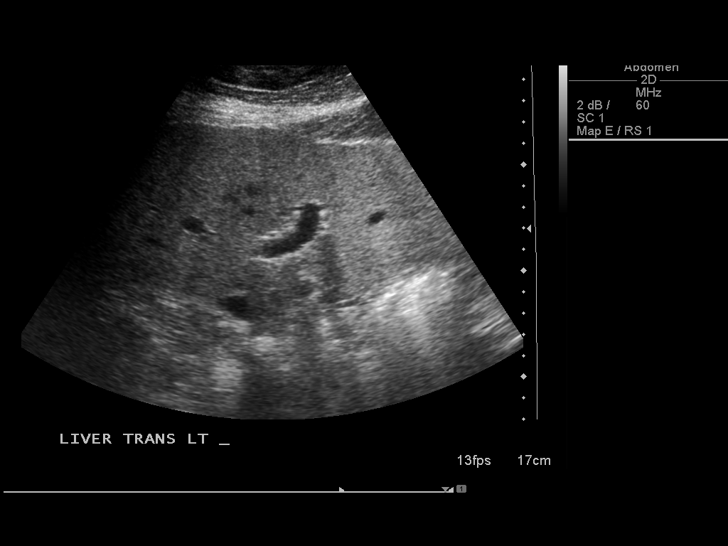
[im 22/40]
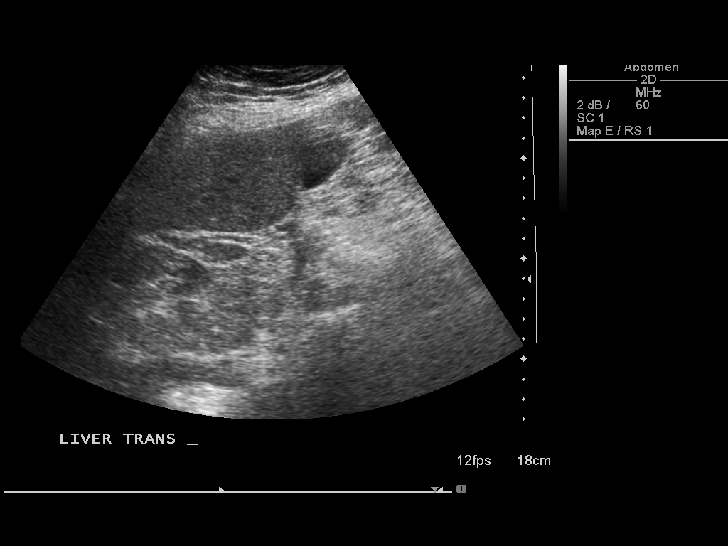
[im 25/40]
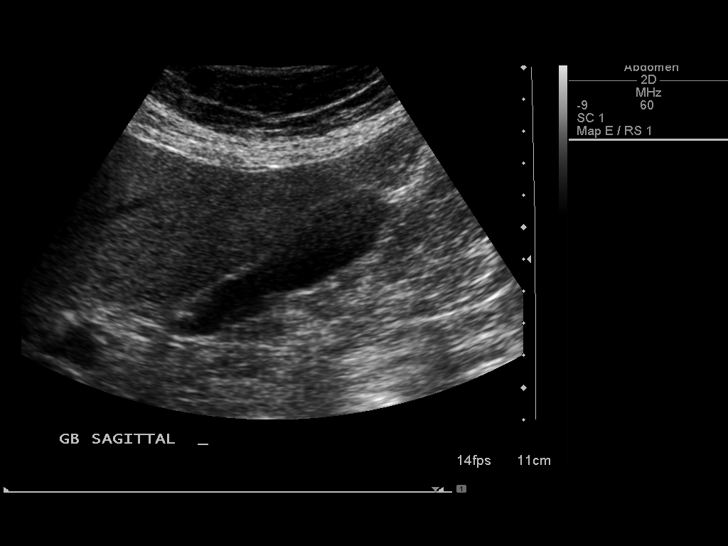
[im 27/40]
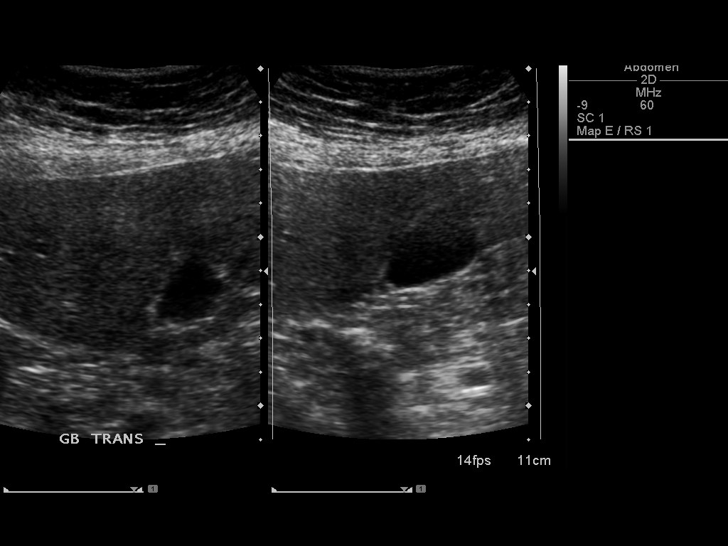
[im 30/40]
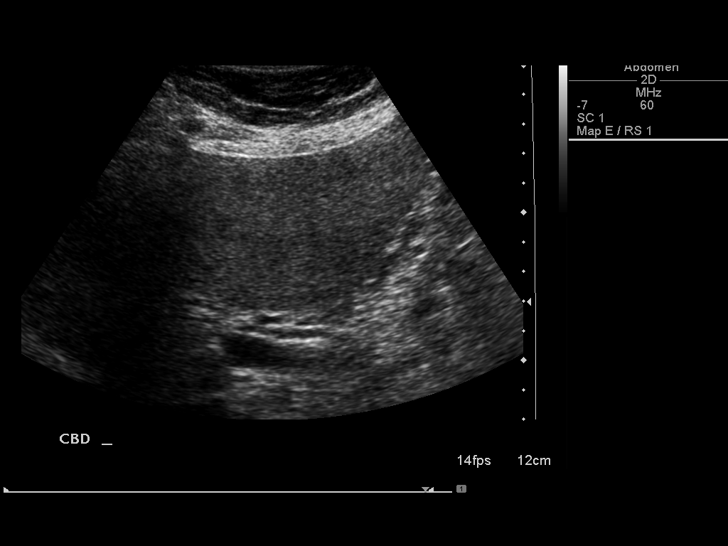
[im 33/40]
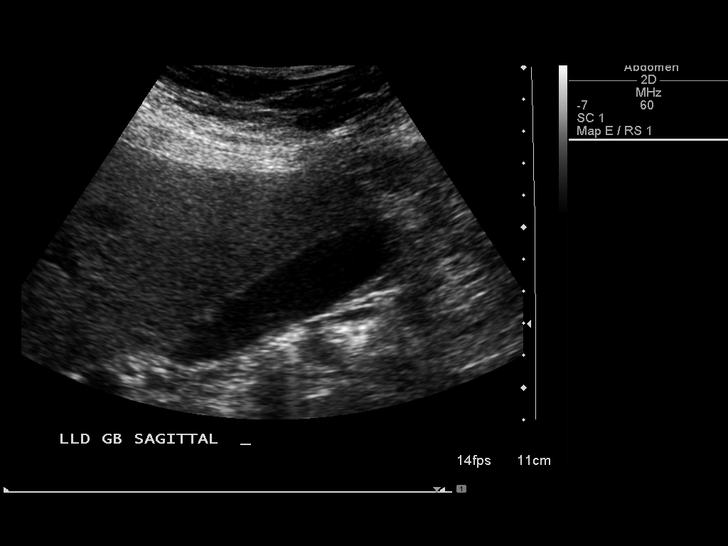
[im 36/40]
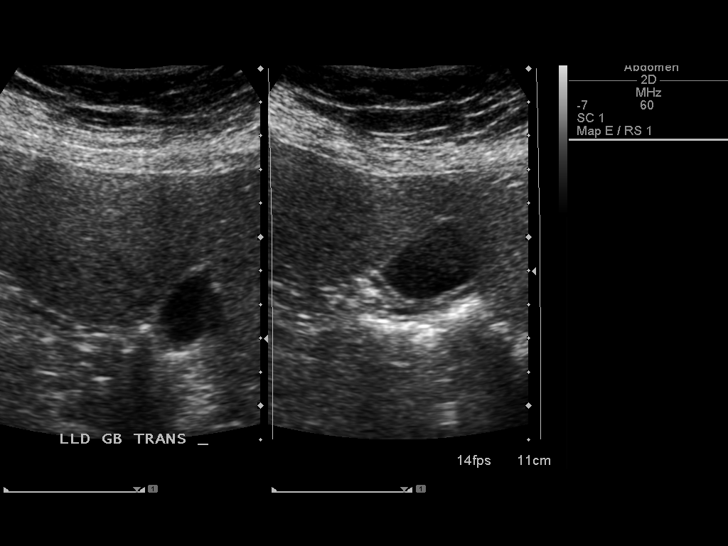
[im 40/40]
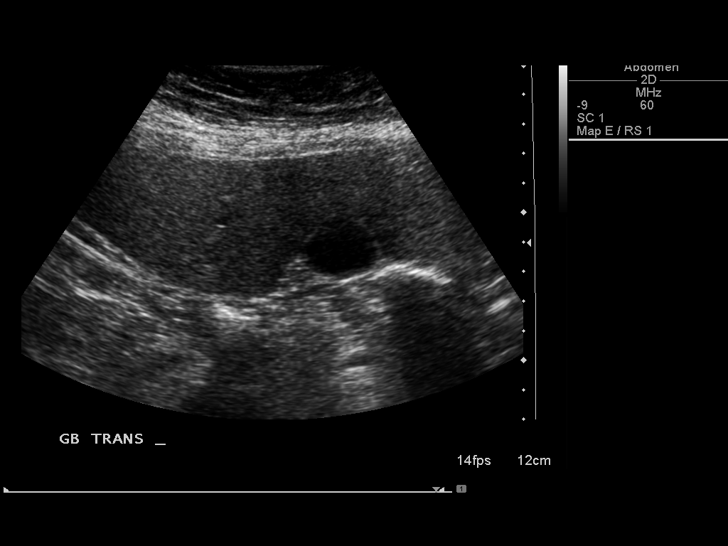

[14 of 25 positions shown; findings below may reference images not displayed]

FINDINGS: Gallbladder:  No shadowing gallstones or echogenic sludge.  No
gallbladder wall thickening or pericholecystic fluid.  Negative
sonographic Murphy's sign according to the ultrasound technologist.

Common bile duct:  4.1 mm diameter, unremarkable.

Liver:  No focal mass lesion seen.  Within normal limits in
parenchymal echogenicity.

Incidental note made of mild right hydronephrosis.  There is a
cm shadowing calculus in the lower pole right renal collecting
system.
IMPRESSION: 1.  Normal gallbladder.
2.  Right nephrolithiasis with hydronephrosis.

## 2015-05-22 IMAGING — CT CT ABD-PELV W/O CM
2 of 4 series · 13 of 32 positions shown, 18 images · non-contrast
Comparison: Ultrasound from the same day

CLINICAL DATA: Some xyphoid and back pain

CT ABDOMEN AND PELVIS WITHOUT CONTRAST
TECHNIQUE: Multidetector CT imaging of the abdomen and pelvis was
performed following the standard protocol without intravenous
contrast.

[Series 2: renal stone · axial · 0.97mm/px · z∈[-378,-63]mm · 6 of 89 slices shown, 11 images]
[im 13/89  soft-tissue]
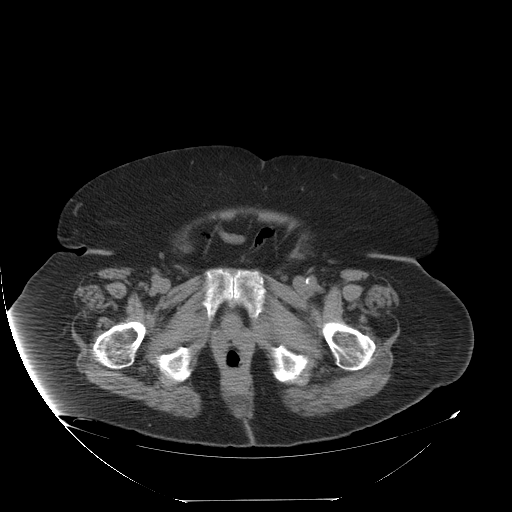
[im 13/89  bone]
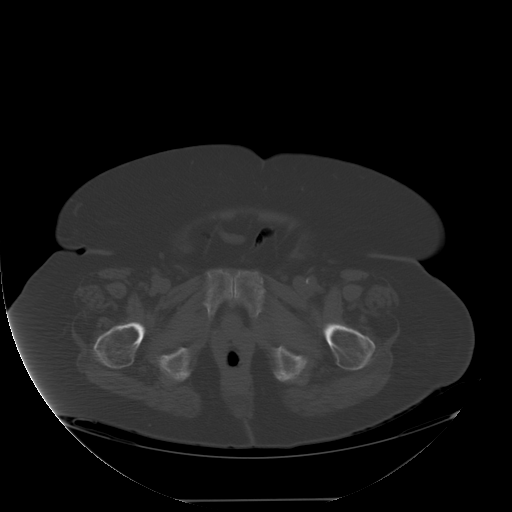
[im 26/89  soft-tissue]
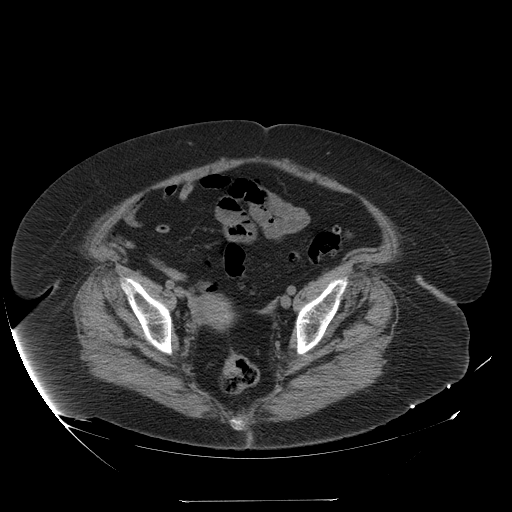
[im 38/89  soft-tissue]
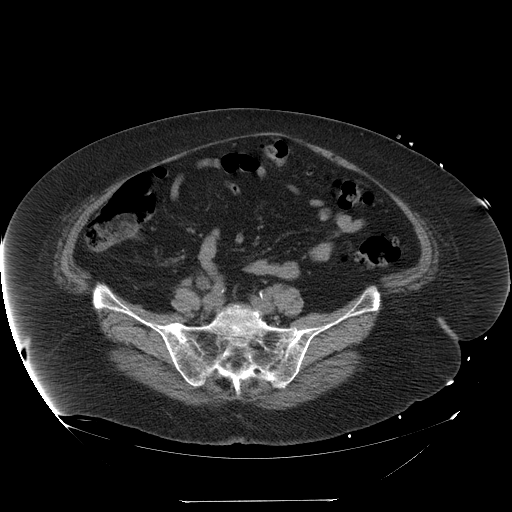
[im 38/89  lung]
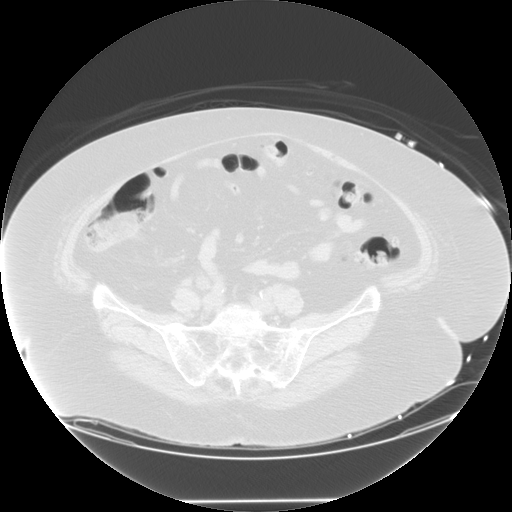
[im 51/89  soft-tissue]
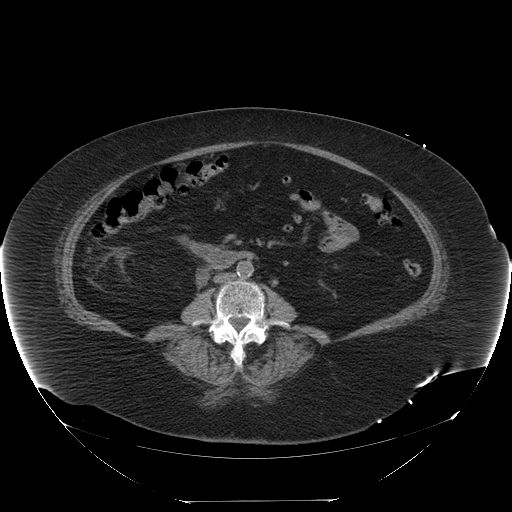
[im 51/89  lung]
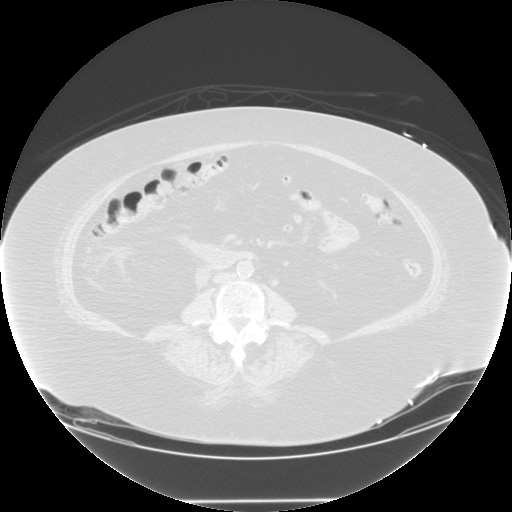
[im 63/89  soft-tissue]
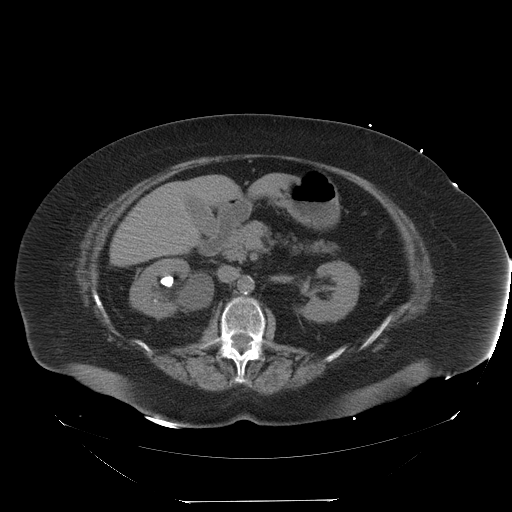
[im 63/89  lung]
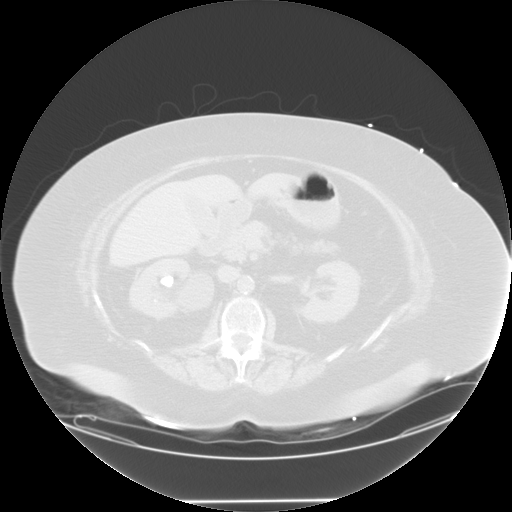
[im 76/89  soft-tissue]
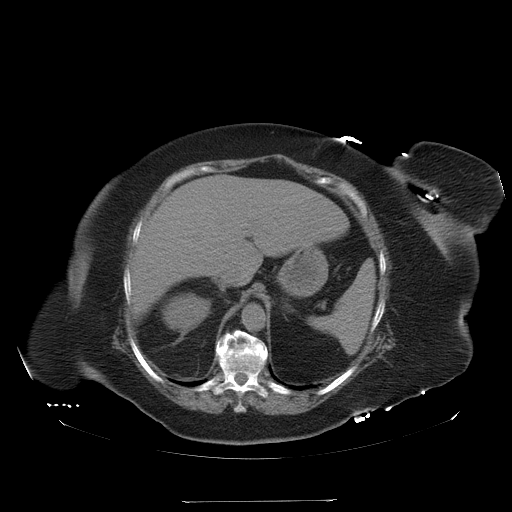
[im 76/89  lung]
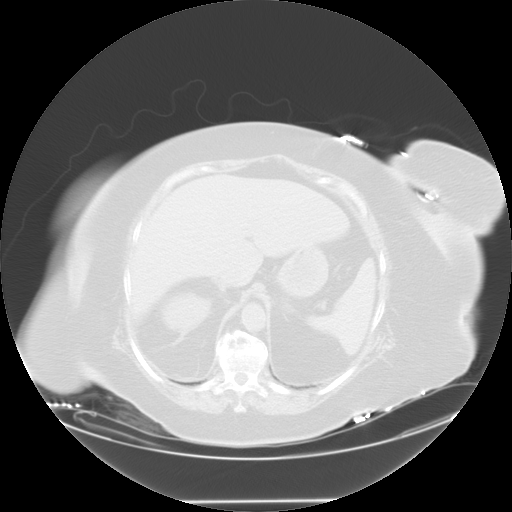

[Series 401: sagittals · sagittal · 0.97mm/px · 7 of 152 slices shown]
[im 13/152  soft-tissue]
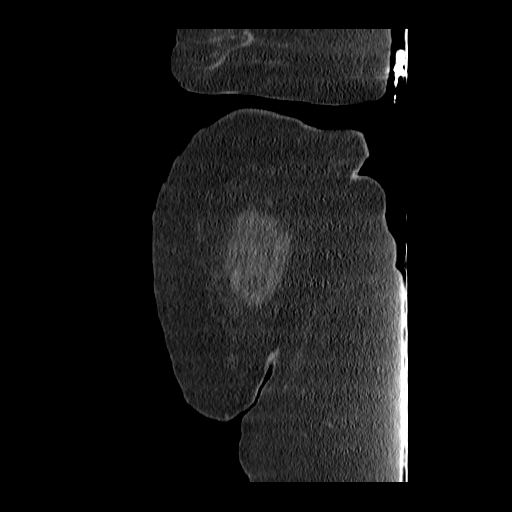
[im 38/152  soft-tissue]
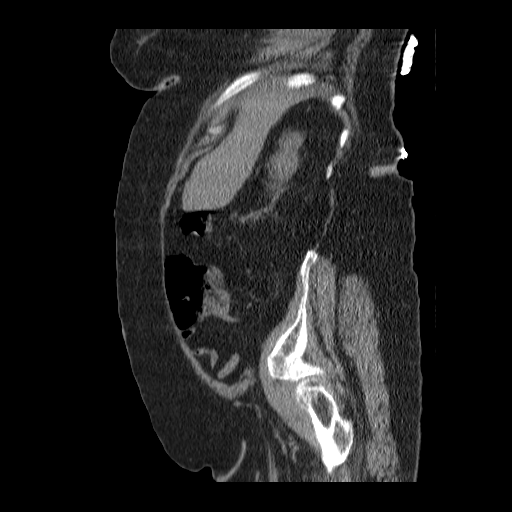
[im 51/152  soft-tissue]
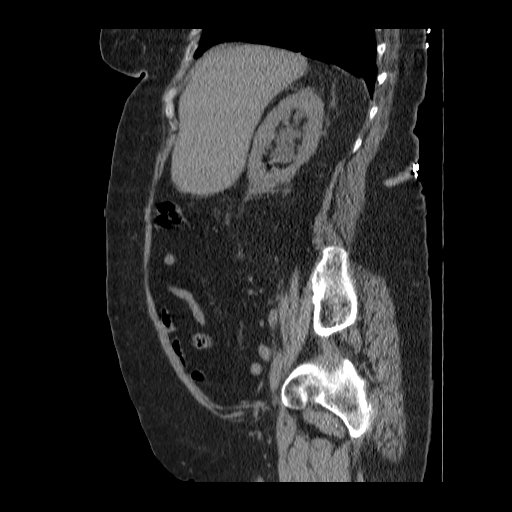
[im 63/152  soft-tissue]
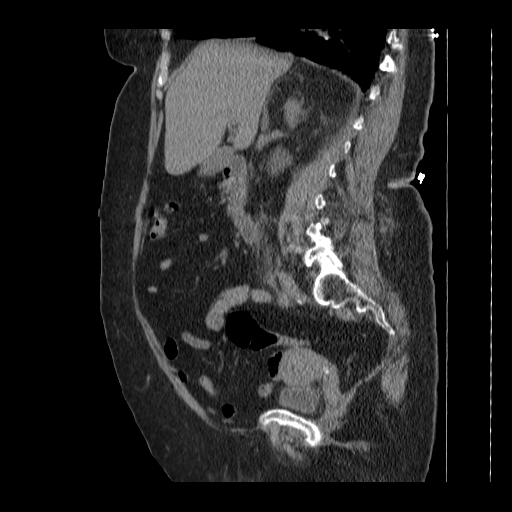
[im 89/152  soft-tissue]
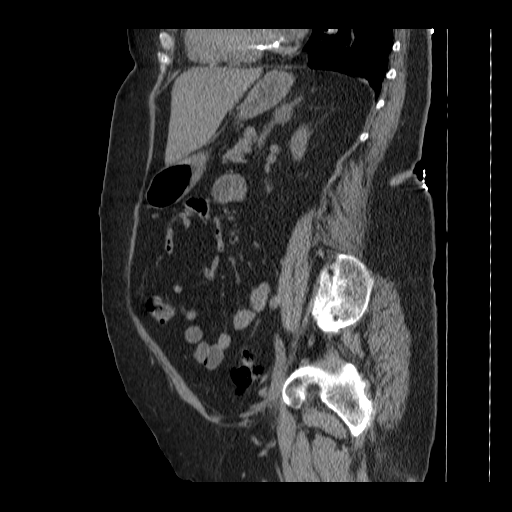
[im 101/152  soft-tissue]
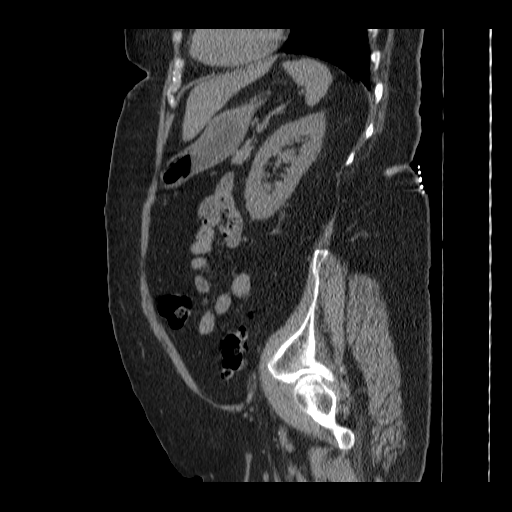
[im 114/152  soft-tissue]
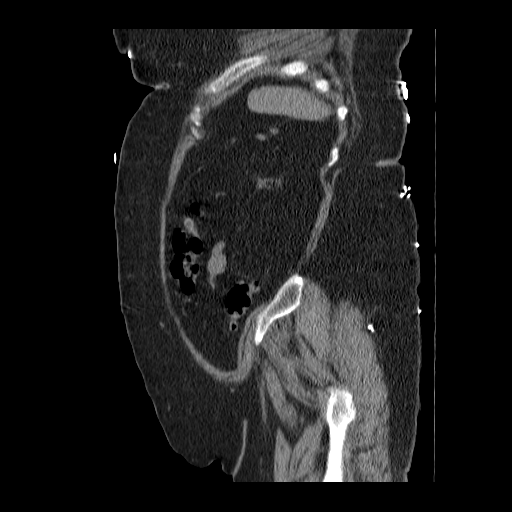

[13 of 32 positions shown; findings below may reference images not displayed]

FINDINGS: Coarse linear scarring or subsegmental atelectasis in the
visualized lung bases.  Coronary calcifications.  Unremarkable
uninfused evaluation of the liver, nondilated gallbladder, spleen,
pancreas.  There is a 2.7 cm left adrenal mass of low attenuation
suggesting adenoma, containing some coarse calcifications at its
margins.

Right nephrolithiasis, largest stone or cluster in the upper pole
measuring 12 mm.  There is a right ureterectasis down to the level
of a 8 mm calculus at the ureteral orifice.  Urinary bladder is
incompletely distended.  Left kidney unremarkable.

Patchy aortoiliac arterial calcifications.  Stomach, small bowel,
and colon are nondilated.  Normal appendix.  Innumerable descending
and sigmoid diverticula without adjacent inflammatory/edematous
change.  Uterus and adnexal regions unremarkable.  Bilateral pelvic
phleboliths.  No ascites.  No free air.  No adenopathy localized.
Spondylitic changes throughout the lumbar spine.
IMPRESSION: 1.  Right nephrolithiasis and hydronephrosis with obstructing 8 mm
calculus near the right ureteral orifice.
2.  2.7 cm left adrenal lesion probably benign adenoma in the
absence of a history of primary carcinoma.
3.  Diverticulosis.
4. Atherosclerosis, including aortoiliac and coronary artery
disease. Please note that although the presence of coronary artery
calcium documents the presence of coronary artery disease, the
severity of this disease and any potential stenosis cannot be
assessed on this non-gated CT examination.  Assessment for
potential risk factor modification, dietary therapy or
pharmacologic therapy may be warranted, if clinically indicated.

## 2015-06-09 ENCOUNTER — Encounter: Payer: Self-pay | Admitting: Gastroenterology

## 2015-06-09 ENCOUNTER — Telehealth: Payer: Self-pay | Admitting: *Deleted

## 2015-06-09 ENCOUNTER — Ambulatory Visit (INDEPENDENT_AMBULATORY_CARE_PROVIDER_SITE_OTHER): Payer: Medicare Other | Admitting: Gastroenterology

## 2015-06-09 VITALS — BP 128/62 | HR 86 | Ht 63.0 in | Wt 219.0 lb

## 2015-06-09 DIAGNOSIS — Q2730 Arteriovenous malformation, site unspecified: Secondary | ICD-10-CM

## 2015-06-09 DIAGNOSIS — A048 Other specified bacterial intestinal infections: Secondary | ICD-10-CM

## 2015-06-09 DIAGNOSIS — D509 Iron deficiency anemia, unspecified: Secondary | ICD-10-CM

## 2015-06-09 DIAGNOSIS — Z86010 Personal history of colonic polyps: Secondary | ICD-10-CM

## 2015-06-09 DIAGNOSIS — B9681 Helicobacter pylori [H. pylori] as the cause of diseases classified elsewhere: Secondary | ICD-10-CM

## 2015-06-09 NOTE — Patient Instructions (Signed)
We will obtain your labs you had drawn yesterday Go to the basement for your lab kit today

## 2015-06-09 NOTE — Telephone Encounter (Signed)
Spoke with patient's caregiver and she states the patient has an OV with Dr. Precious Haws tomorrow and she will get them to do the labs. Called Dr. Merilyn Baba office([phone]) Spoke with the nurse there and patient does not have an OV there tomorrow. The nurse said she will call the patient's caregiver and see if she can get them to come to their office for the labs. She will fax them to [place] if the patient agrees to come there.

## 2015-06-09 NOTE — Telephone Encounter (Signed)
-----   Message from Manus Gunning, MD sent at 06/09/2015  1:40 PM EDT ----- Regarding: labs needed Allison Choi I saw this patient in clinic today. I had been under the impression she had a CBC yesterday. The lab returned and the CBC was not done, only BMP. Can you order a CBC for her with ferritin and iron panel, she can get this done at her convenience. Her POC is the number listed in my clinic note. Thanks much

## 2015-06-09 NOTE — Progress Notes (Signed)
HPI :  82 y/o female with obesity, known to me from a recent hospitalization from February, here for a follow up. She presented to the hospital in February with symptomatic anemia - she had fatigue, weakness, and DOE. Her Hgb was 5.2 at the time with MCV of 64, and a ferritin of 4. At that time she denies any overt GI bleeding, and had no abdominal pain, weight loss, bowel changes, chest pains, dysphagia, etc.   She was given a PRBC transfusion and underwent an endoscopic evaluation with the following findings: EGD 05/05/15: 2 Duodenal AVMs ablated, one small and one moderate in size. H  Pylori (+) gastritis, no ulcers Colonoscopy 05/05/15: diverticulosis, internal hemorrhoids, cecal and sigmoid polypectomy c/w adenomas, normal ileum  She completed a 10 day course of Pylera for H pylori and tolerated it fine along with PPI. She is taking oral iron twice daily since the hospitalization and states she feels remarkably improved. She doesn't look at her stools. She denies any obvious blood in the stools. No changes in bowel habits. No abdominal pains that bother her. She is eating well. She reports her energy has improved. She had labs checked yesteday by her primary care and we don't have the results yet. Weight is stable, she is generally feeling well. Hgb at time of discharge was in the 9s.   Past Medical History  Diagnosis Date  . H. pylori infection   . Iron deficiency anemia   . AVM (arteriovenous malformation)   . Obesity     Past Surgical History  Procedure Laterality Date  . Tubal ligation    . Colonoscopy N/A 05/05/2015    Procedure: COLONOSCOPY;  Surgeon: Manus Gunning, MD;  Location: [facility] ENDOSCOPY;  Service: Gastroenterology;  Laterality: N/A;  . Esophagogastroduodenoscopy N/A 05/05/2015    Procedure: ESOPHAGOGASTRODUODENOSCOPY (EGD);  Surgeon: Manus Gunning, MD;  Location: [facility];  Service: Gastroenterology;  Laterality: N/A;   Family History  Problem  Relation Age of Onset  . Diabetes Mellitus II Son    Social History  Substance Use Topics  . Smoking status: Never Smoker   . Smokeless tobacco: None  . Alcohol Use: No   Current Outpatient Prescriptions  Medication Sig Dispense Refill  . ferrous sulfate (FERROUSUL) 325 (65 FE) MG tablet Take 1 tablet (325 mg total) by mouth 2 (two) times daily with a meal. 30 tablet 1   No current facility-administered medications for this visit.   No Known Allergies   Review of Systems: All systems reviewed and negative except where noted in HPI.   Lab Results  Component Value Date   WBC 9.4 05/06/2015   HGB 9.1* 05/06/2015   HCT 32.4* 05/06/2015   MCV 70.1* 05/06/2015   PLT 366 05/06/2015    Lab Results  Component Value Date   IRON 9* 05/03/2015   TIBC 385 05/03/2015   FERRITIN 4* 05/03/2015     Physical Exam: BP 128/62 mmHg  Pulse 86  Ht 5\' 3"  (1.6 m)  Wt 219 lb (99.338 kg)  BMI 38.80 kg/m2 Constitutional: Pleasant,well-developed, female in no acute distress. HEENT: Normocephalic and atraumatic. Conjunctivae are normal. No scleral icterus. Neck supple.  Cardiovascular: Normal rate, regular rhythm.  Pulmonary/chest: Effort normal and breath sounds normal. No wheezing, rales or rhonchi. Abdominal: Soft, nondistended, obese / protuberant, nontender. Bowel sounds active throughout. There are no masses palpable. No hepatomegaly. Extremities: (+) 1 edema BL LE Lymphadenopathy: No cervical adenopathy noted. Neurological: Alert and oriented to person place and  time. Skin: Skin is warm and dry. No rashes noted. Psychiatric: Normal mood and affect. Behavior is normal.   ASSESSMENT AND PLAN: 82 y/o female who presented last month with symptomatic iron deficiency anemia with (+) FOBT stools. She had no overt GI bleeding or symptoms otherwise at the time. Her colonoscopy did not reveal any pathology to cause anemia - a few adenomas removed, diverticulosis, and hemorrhoids. Ileum was  normal. EGD showed 2 duodenal AVMs which were ablated and H pylori (+) gastritis, although no obvious ulcerations noted. She has been on oral iron since that time and feeling much better. H pylori has been treated.   It is possible she had anemia from small bowel AVMs +/- component of H pylori gastritis, however her iron deficiency anemia was quite severe. I discussed capsule endoscopy with her to evaluate her small bowel and ensure no small bowel pathology which could have contributed to her anemia. He has had a CT scan in 2014 which did not show any small bowel pathology. I discussed risks / benefits of the capsule. The patient states she "never wants to drink prep again" and doesn't want to do the capsule if she can avoid it. Given the severity of the anemia I think it's reasonable to do the capsule however will await her CBC and iron studies, she had blood work drawn by her primary care yesterday and will obtain these results. If her anemia has resolved we may take her off oral iron and monitor, and if it recurs again off iron she will be amenable to the capsule. If her anemia is not significantly improved or worsening despite oral iron she will also be amenable to doing it. I will contact them with the results of the lab when I receive them. She agreed with the plan, and will avoid NSAIDs in the interim. I will otherwise send H pylori stool AG to confirm H pylori eradication now that she has completed therapy.   Regarding her adenomas, she had 2 small adenomas removed, given her age I don't think she warrants further surveillance colonoscopy. She agreed.   Of note, contact phone is care giver to relay results is [phone]  Amboy Cellar, MD [facility] Pager [phone]

## 2015-06-10 ENCOUNTER — Other Ambulatory Visit: Payer: Medicare Other

## 2015-06-10 DIAGNOSIS — Q2730 Arteriovenous malformation, site unspecified: Secondary | ICD-10-CM

## 2015-06-10 DIAGNOSIS — D509 Iron deficiency anemia, unspecified: Secondary | ICD-10-CM

## 2015-06-10 DIAGNOSIS — A048 Other specified bacterial intestinal infections: Secondary | ICD-10-CM

## 2015-06-10 NOTE — Telephone Encounter (Signed)
Thanks for coordinating.

## 2015-06-12 LAB — H. PYLORI ANTIGEN, STOOL: H pylori Ag, Stl: POSITIVE — AB

## 2015-06-13 ENCOUNTER — Other Ambulatory Visit: Payer: Self-pay | Admitting: *Deleted

## 2015-06-13 ENCOUNTER — Telehealth: Payer: Self-pay | Admitting: Gastroenterology

## 2015-06-13 MED ORDER — CLARITHROMYCIN 500 MG PO TABS: 500.0000 mg | ORAL_TABLET | Freq: Two times a day (BID) | ORAL | Status: AC

## 2015-06-13 MED ORDER — METRONIDAZOLE 500 MG PO TABS: 500.0000 mg | ORAL_TABLET | Freq: Two times a day (BID) | ORAL | Status: AC

## 2015-06-13 MED ORDER — AMOXICILLIN 500 MG PO TABS: 500.0000 mg | ORAL_TABLET | Freq: Two times a day (BID) | ORAL | Status: AC

## 2015-06-13 MED ORDER — OMEPRAZOLE 40 MG PO CPDR: 40.0000 mg | DELAYED_RELEASE_CAPSULE | Freq: Two times a day (BID) | ORAL | Status: AC

## 2015-06-13 NOTE — Telephone Encounter (Signed)
Patient passed away per daughter.

## 2015-06-13 NOTE — Progress Notes (Signed)
I'm so sorry to hear this, how tragic, thanks for letting me know

## 2015-07-11 DEATH — deceased

## 2018-01-30 IMAGING — CR DG CHEST 1V PORT
1 series · 1 of 1 positions shown · non-contrast
Comparison: None.

CLINICAL DATA: Shortness of breath tonight.

EXAM:
PORTABLE CHEST 1 VIEW

[AP]
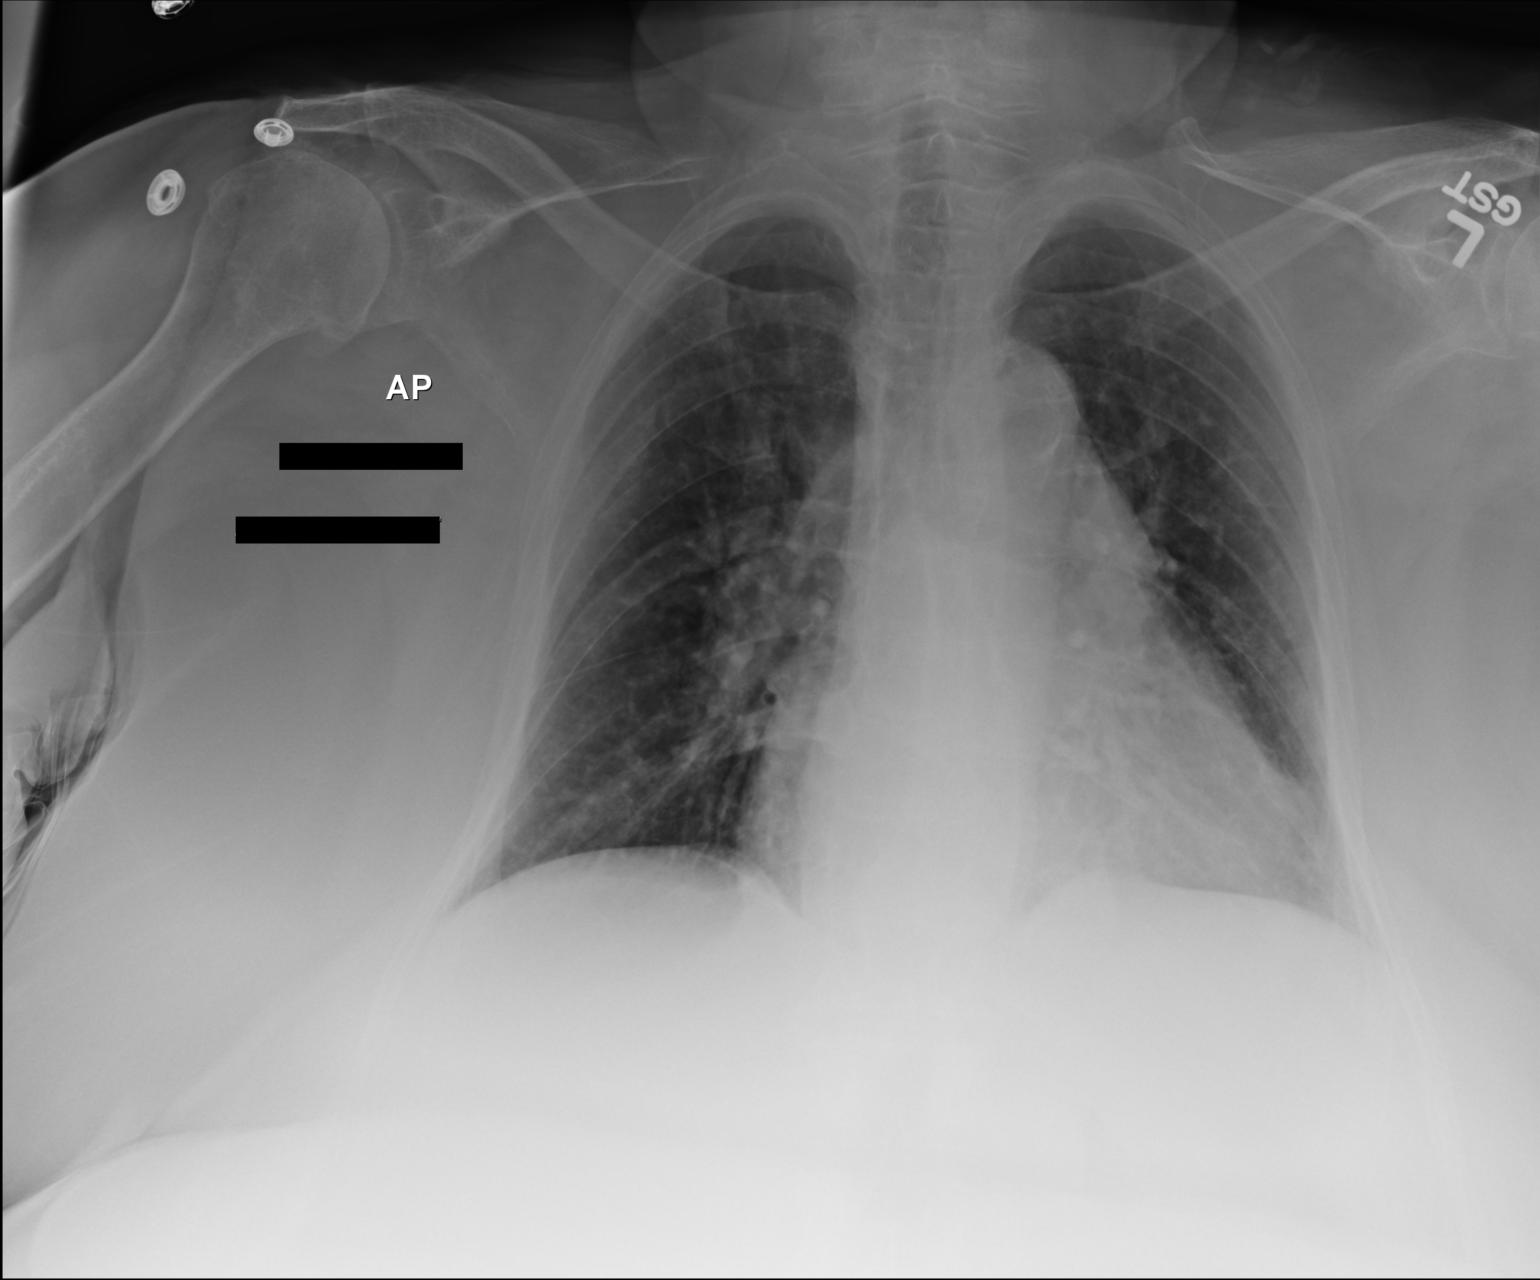

[1 of 1 positions shown; findings below may reference images not displayed]

FINDINGS: Mild cardiomegaly. Aortic atherosclerosis. No pulmonary edema.
Minimal subsegmental retrocardiac atelectasis. No confluent airspace
disease. No pleural effusion or pneumothorax.
IMPRESSION: Mild cardiomegaly.  No localizing process.
